# Patient Record
Sex: Male | Born: 1965 | Race: Black or African American | Hispanic: No | Smoking: Former smoker
Health system: Southern US, Community
[De-identification: ages and names within clinical notes are randomized; demographics above are authoritative.]

## PROBLEM LIST (undated history)

## (undated) DIAGNOSIS — R55 Syncope and collapse: Secondary | ICD-10-CM

## (undated) DIAGNOSIS — R001 Bradycardia, unspecified: Secondary | ICD-10-CM

## (undated) DIAGNOSIS — R45851 Suicidal ideations: Secondary | ICD-10-CM

## (undated) DIAGNOSIS — Z950 Presence of cardiac pacemaker: Secondary | ICD-10-CM

## (undated) DIAGNOSIS — F259 Schizoaffective disorder, unspecified: Secondary | ICD-10-CM

## (undated) DIAGNOSIS — IMO0002 Reserved for concepts with insufficient information to code with codable children: Secondary | ICD-10-CM

## (undated) DIAGNOSIS — F329 Major depressive disorder, single episode, unspecified: Secondary | ICD-10-CM

## (undated) DIAGNOSIS — F32A Depression, unspecified: Secondary | ICD-10-CM

## (undated) DIAGNOSIS — I2510 Atherosclerotic heart disease of native coronary artery without angina pectoris: Secondary | ICD-10-CM

---

## 2001-04-14 ENCOUNTER — Emergency Department (HOSPITAL_COMMUNITY): Admission: EM | Admit: 2001-04-14 | Discharge: 2001-04-14 | Payer: Self-pay | Admitting: Emergency Medicine

## 2005-09-22 HISTORY — PX: PACEMAKER INSERTION: SHX728

## 2005-11-14 ENCOUNTER — Ambulatory Visit: Payer: Self-pay | Admitting: Family Medicine

## 2006-01-30 ENCOUNTER — Ambulatory Visit (HOSPITAL_COMMUNITY): Admission: RE | Admit: 2006-01-30 | Discharge: 2006-01-30 | Payer: Self-pay

## 2006-04-13 ENCOUNTER — Encounter: Payer: Self-pay | Admitting: Cardiology

## 2006-08-15 ENCOUNTER — Ambulatory Visit: Payer: Self-pay | Admitting: Cardiology

## 2007-05-29 ENCOUNTER — Ambulatory Visit: Payer: Self-pay | Admitting: Cardiology

## 2007-05-31 ENCOUNTER — Inpatient Hospital Stay (HOSPITAL_COMMUNITY): Admission: AD | Admit: 2007-05-31 | Discharge: 2007-06-01 | Payer: Self-pay | Admitting: *Deleted

## 2007-06-09 ENCOUNTER — Emergency Department (HOSPITAL_COMMUNITY): Admission: EM | Admit: 2007-06-09 | Discharge: 2007-06-09 | Payer: Self-pay | Admitting: Physician Assistant

## 2007-06-12 ENCOUNTER — Emergency Department (HOSPITAL_COMMUNITY): Admission: EM | Admit: 2007-06-12 | Discharge: 2007-06-13 | Payer: Self-pay | Admitting: Emergency Medicine

## 2007-06-29 ENCOUNTER — Emergency Department (HOSPITAL_COMMUNITY): Admission: EM | Admit: 2007-06-29 | Discharge: 2007-06-29 | Payer: Self-pay | Admitting: Emergency Medicine

## 2007-07-07 ENCOUNTER — Emergency Department (HOSPITAL_COMMUNITY): Admission: EM | Admit: 2007-07-07 | Discharge: 2007-07-07 | Payer: Self-pay | Admitting: Emergency Medicine

## 2008-02-12 ENCOUNTER — Emergency Department (HOSPITAL_COMMUNITY): Admission: EM | Admit: 2008-02-12 | Discharge: 2008-02-12 | Payer: Self-pay | Admitting: Emergency Medicine

## 2008-06-06 ENCOUNTER — Emergency Department (HOSPITAL_COMMUNITY): Admission: EM | Admit: 2008-06-06 | Discharge: 2008-06-06 | Payer: Self-pay | Admitting: Emergency Medicine

## 2008-06-19 ENCOUNTER — Emergency Department (HOSPITAL_COMMUNITY): Admission: EM | Admit: 2008-06-19 | Discharge: 2008-06-19 | Payer: Self-pay | Admitting: Emergency Medicine

## 2008-07-05 ENCOUNTER — Emergency Department (HOSPITAL_COMMUNITY): Admission: EM | Admit: 2008-07-05 | Discharge: 2008-07-05 | Payer: Self-pay | Admitting: Emergency Medicine

## 2008-07-17 ENCOUNTER — Emergency Department (HOSPITAL_COMMUNITY): Admission: EM | Admit: 2008-07-17 | Discharge: 2008-07-18 | Payer: Self-pay | Admitting: Emergency Medicine

## 2008-07-17 ENCOUNTER — Ambulatory Visit: Payer: Self-pay | Admitting: Psychiatry

## 2008-07-18 ENCOUNTER — Inpatient Hospital Stay (HOSPITAL_COMMUNITY): Admission: RE | Admit: 2008-07-18 | Discharge: 2008-07-24 | Payer: Self-pay | Admitting: Psychiatry

## 2008-10-18 ENCOUNTER — Emergency Department (HOSPITAL_COMMUNITY): Admission: EM | Admit: 2008-10-18 | Discharge: 2008-10-18 | Payer: Self-pay | Admitting: Emergency Medicine

## 2009-01-11 ENCOUNTER — Emergency Department (HOSPITAL_COMMUNITY): Admission: EM | Admit: 2009-01-11 | Discharge: 2009-01-11 | Payer: Self-pay | Admitting: Emergency Medicine

## 2009-04-10 ENCOUNTER — Emergency Department (HOSPITAL_COMMUNITY): Admission: EM | Admit: 2009-04-10 | Discharge: 2009-04-11 | Payer: Self-pay | Admitting: Emergency Medicine

## 2009-04-27 ENCOUNTER — Emergency Department (HOSPITAL_COMMUNITY): Admission: EM | Admit: 2009-04-27 | Discharge: 2009-04-27 | Payer: Self-pay | Admitting: Emergency Medicine

## 2010-02-23 ENCOUNTER — Emergency Department (HOSPITAL_COMMUNITY): Admission: EM | Admit: 2010-02-23 | Discharge: 2010-02-23 | Payer: Self-pay | Admitting: Emergency Medicine

## 2010-07-25 ENCOUNTER — Encounter: Payer: Self-pay | Admitting: Family Medicine

## 2010-10-04 NOTE — Letter (Signed)
Summary: medical release  medical release   Imported By: Lind Guest 08/05/2010 11:32:08  _____________________________________________________________________  External Attachment:    Type:   Image     Comment:   External Document

## 2010-11-20 ENCOUNTER — Emergency Department (HOSPITAL_COMMUNITY): Payer: Self-pay

## 2010-11-20 ENCOUNTER — Observation Stay (HOSPITAL_COMMUNITY)
Admission: EM | Admit: 2010-11-20 | Discharge: 2010-11-21 | Disposition: A | Discharge status: Other Acute Inpatient Hospital | Payer: Self-pay | Attending: Internal Medicine | Admitting: Internal Medicine

## 2010-11-20 DIAGNOSIS — R1013 Epigastric pain: Secondary | ICD-10-CM | POA: Insufficient documentation

## 2010-11-20 DIAGNOSIS — R0609 Other forms of dyspnea: Secondary | ICD-10-CM | POA: Insufficient documentation

## 2010-11-20 DIAGNOSIS — R0989 Other specified symptoms and signs involving the circulatory and respiratory systems: Secondary | ICD-10-CM | POA: Insufficient documentation

## 2010-11-20 DIAGNOSIS — D696 Thrombocytopenia, unspecified: Secondary | ICD-10-CM | POA: Insufficient documentation

## 2010-11-20 DIAGNOSIS — F411 Generalized anxiety disorder: Secondary | ICD-10-CM | POA: Insufficient documentation

## 2010-11-20 DIAGNOSIS — R61 Generalized hyperhidrosis: Secondary | ICD-10-CM | POA: Insufficient documentation

## 2010-11-20 DIAGNOSIS — Z950 Presence of cardiac pacemaker: Secondary | ICD-10-CM | POA: Insufficient documentation

## 2010-11-20 DIAGNOSIS — R110 Nausea: Secondary | ICD-10-CM | POA: Insufficient documentation

## 2010-11-20 DIAGNOSIS — R45851 Suicidal ideations: Secondary | ICD-10-CM | POA: Insufficient documentation

## 2010-11-20 DIAGNOSIS — K219 Gastro-esophageal reflux disease without esophagitis: Secondary | ICD-10-CM | POA: Insufficient documentation

## 2010-11-20 DIAGNOSIS — R112 Nausea with vomiting, unspecified: Secondary | ICD-10-CM | POA: Insufficient documentation

## 2010-11-20 DIAGNOSIS — D649 Anemia, unspecified: Secondary | ICD-10-CM | POA: Insufficient documentation

## 2010-11-20 DIAGNOSIS — R079 Chest pain, unspecified: Principal | ICD-10-CM | POA: Insufficient documentation

## 2010-11-20 LAB — COMPREHENSIVE METABOLIC PANEL
ALT: 16 U/L (ref 0–53)
AST: 28 U/L (ref 0–37)
Albumin: 3.7 g/dL (ref 3.5–5.2)
Alkaline Phosphatase: 87 U/L (ref 39–117)
BUN: 8 mg/dL (ref 6–23)
BUN: 8 mg/dL (ref 6–23)
CO2: 29 mEq/L (ref 19–32)
Chloride: 105 mEq/L (ref 96–112)
Creatinine, Ser: 0.89 mg/dL (ref 0.4–1.5)
Creatinine, Ser: 0.95 mg/dL (ref 0.4–1.5)
GFR calc non Af Amer: >60 mL/min (ref >60)
Glucose, Bld: 84 mg/dL (ref 70–99)
Potassium: 3.9 mEq/L (ref 3.5–5.1)
Sodium: 138 mEq/L (ref 135–145)
Total Bilirubin: 0.7 mg/dL (ref 0.3–1.2)
Total Bilirubin: 0.5 mg/dL (ref 0.3–1.2)
Total Protein: 6.6 g/dL (ref 6.0–8.3)

## 2010-11-20 LAB — CARDIAC PANEL(CRET KIN+CKTOT+MB+TROPI)
CK, MB: 1.3 ng/mL (ref 0.3–4.0)
CK, MB: 1.9 ng/mL (ref 0.3–4.0)
Relative Index: 0.9 (ref 0.0–2.5)
Total CK: 167 U/L (ref 7–232)
Troponin I: <0.01 ng/mL (ref 0.00–0.06)

## 2010-11-20 LAB — POCT I-STAT, CHEM 8
Calcium, Ion: 1.14 mmol/L (ref 1.12–1.32)
HCT: 40 % (ref 39.0–52.0)
Hemoglobin: 13.6 g/dL (ref 13.0–17.0)
Sodium: 138 mEq/L (ref 135–145)
TCO2: 27 mmol/L (ref 0–100)

## 2010-11-20 LAB — IRON AND TIBC
Iron: 131 ug/dL (ref 42–135)
TIBC: 294 ug/dL (ref 215–435)
UIBC: 163 ug/dL

## 2010-11-20 LAB — DIFFERENTIAL
Basophils Relative: 0 % (ref 0–1)
Eosinophils Relative: 3 % (ref 0–5)
Lymphs Abs: 2.1 10*3/uL (ref 0.7–4.0)
Monocytes Relative: 11 % (ref 3–12)
Neutro Abs: 3.8 10*3/uL (ref 1.7–7.7)
Neutrophils Relative %: 55 % (ref 43–77)

## 2010-11-20 LAB — CBC
HCT: 36.8 % — ABNORMAL LOW (ref 39.0–52.0)
MCH: 33.5 pg (ref 26.0–34.0)
MCHC: 34 g/dL (ref 30.0–36.0)
MCV: 98.7 fL (ref 78.0–100.0)

## 2010-11-20 LAB — POCT CARDIAC MARKERS
CKMB, poc: <1 ng/mL — ABNORMAL LOW (ref 1.0–8.0)
CKMB, poc: <1 ng/mL — ABNORMAL LOW (ref 1.0–8.0)
Myoglobin, poc: 42 ng/mL (ref 12–200)
Myoglobin, poc: 45.1 ng/mL (ref 12–200)
Troponin i, poc: <0.05 ng/mL (ref 0.00–0.09)

## 2010-11-20 LAB — RAPID URINE DRUG SCREEN, HOSP PERFORMED
Amphetamines: NOT DETECTED
Benzodiazepines: NOT DETECTED
Cocaine: NOT DETECTED
Opiates: POSITIVE — AB
Tetrahydrocannabinol: NOT DETECTED

## 2010-11-20 LAB — HIV ANTIBODY (ROUTINE TESTING W REFLEX): HIV: NONREACTIVE

## 2010-11-20 LAB — TSH: TSH: 1.722 u[IU]/mL (ref 0.350–4.500)

## 2010-11-21 ENCOUNTER — Inpatient Hospital Stay (HOSPITAL_COMMUNITY)
Admission: AD | Admit: 2010-11-21 | Discharge: 2010-11-26 | DRG: 885 | Disposition: A | Discharge status: Home / Self Care | Payer: 59 | Source: Ambulatory Visit | Attending: Psychiatry | Admitting: Psychiatry

## 2010-11-21 ENCOUNTER — Other Ambulatory Visit: Payer: Self-pay | Admitting: Internal Medicine

## 2010-11-21 DIAGNOSIS — Z7982 Long term (current) use of aspirin: Secondary | ICD-10-CM

## 2010-11-21 DIAGNOSIS — Z560 Unemployment, unspecified: Secondary | ICD-10-CM

## 2010-11-21 DIAGNOSIS — F319 Bipolar disorder, unspecified: Principal | ICD-10-CM

## 2010-11-21 DIAGNOSIS — R45851 Suicidal ideations: Secondary | ICD-10-CM

## 2010-11-21 DIAGNOSIS — F259 Schizoaffective disorder, unspecified: Secondary | ICD-10-CM

## 2010-11-21 DIAGNOSIS — R079 Chest pain, unspecified: Secondary | ICD-10-CM

## 2010-11-21 DIAGNOSIS — D696 Thrombocytopenia, unspecified: Secondary | ICD-10-CM

## 2010-11-21 DIAGNOSIS — K219 Gastro-esophageal reflux disease without esophagitis: Secondary | ICD-10-CM

## 2010-11-21 DIAGNOSIS — Z950 Presence of cardiac pacemaker: Secondary | ICD-10-CM

## 2010-11-21 DIAGNOSIS — IMO0002 Reserved for concepts with insufficient information to code with codable children: Secondary | ICD-10-CM

## 2010-11-21 LAB — BASIC METABOLIC PANEL
BUN: 8 mg/dL (ref 6–23)
CO2: 28 mEq/L (ref 19–32)
Calcium: 8.9 mg/dL (ref 8.4–10.5)
Creatinine, Ser: 0.89 mg/dL (ref 0.4–1.5)
GFR calc Af Amer: >60 mL/min (ref >60)

## 2010-11-21 LAB — CBC
Hemoglobin: 13.3 g/dL (ref 13.0–17.0)
MCH: 33.3 pg (ref 26.0–34.0)
MCHC: 33.8 g/dL (ref 30.0–36.0)
MCV: 98.3 fL (ref 78.0–100.0)

## 2010-11-21 LAB — LIPID PANEL
LDL Cholesterol: 37 mg/dL (ref 0–99)
Total CHOL/HDL Ratio: 1.8 RATIO
VLDL: 6 mg/dL (ref 0–40)

## 2010-11-22 DIAGNOSIS — F319 Bipolar disorder, unspecified: Secondary | ICD-10-CM

## 2010-11-22 DIAGNOSIS — F259 Schizoaffective disorder, unspecified: Secondary | ICD-10-CM
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
## 2010-11-26 LAB — HEPATIC FUNCTION PANEL
ALT: 34 U/L (ref 0–53)
AST: 41 U/L — ABNORMAL HIGH (ref 0–37)
Albumin: 3.6 g/dL (ref 3.5–5.2)
Alkaline Phosphatase: 100 U/L (ref 39–117)
Total Bilirubin: 0.6 mg/dL (ref 0.3–1.2)
Total Protein: 6.4 g/dL (ref 6.0–8.3)

## 2010-11-26 LAB — CBC
HCT: 40.8 % (ref 39.0–52.0)
Hemoglobin: 13 g/dL (ref 13.0–17.0)
RBC: 4.04 MIL/uL — ABNORMAL LOW (ref 4.22–5.81)
WBC: 7.6 10*3/uL (ref 4.0–10.5)

## 2010-11-26 NOTE — Consult Note (Signed)
  NAME:  Harry Zhang, Harry Zhang NO.:  [id number]  MEDICAL RECORD NO.:  [id number]           PATIENT TYPE:  O  LOCATION:  3730                         FACILITY:  [facility]  PHYSICIAN:  Eulogio Ditch, MD DATE OF BIRTH:  1966/07/26  DATE OF CONSULTATION:  11/21/2010 DATE OF DISCHARGE:                                CONSULTATION   REASON FOR CONSULTATION:  Depression and suicidal ideation.  HISTORY OF PRESENT ILLNESS:  A 45 year old male with history of recurrent syncopal episode.  The patient has a pacemaker implant.  The patient also has a history of bipolar disorder and in the past, he was on Depakote and Haldol and was admitted to behavioral health.  The patient told me after discharge he followed at [facility], but then stopped taking his medications.  The patient told me that he on and off hear voices, but the patient does not seem to be internally preoccupied, he is very logical and goal directed during the interview.  The patient also told me that he is not going to lie about suicidal ideation and he has suicidal ideations on and off and will be good idea for him to be admitted to behavioral health to work on suicidal ideation and hearing voices.  The patient denies abusing any drugs or alcohol abuse.  The patient lives in [place].  He is not working.  He is divorced.  PAST MEDICAL HISTORY:  Significant for pacemaker implant in 2007, secondary to recurrent syncope.  ALLERGIES:  No known drug allergies.  MENTAL STATUS EXAM:  The patient is calm, cooperative during interview. Fair eye contact.  No psychomotor agitation or retardation noted during the interview.  Speech normal in rate, rhythm, and volume.  Mood depressed, affect mood congruent.  Thought process logical and goal directed.  Does not seem to be internally preoccupied.  No circumstantiality or tangentiality noted during the interview.  Thought content, suicidal ideations present without specific plan,  not delusional.  Thought perception, reported hearing voices, telling him to remove the pacemaker.  The patient does not seem to be internally preoccupied.  Cognition alert, awake, oriented x3.  Memory immediate, recent remote fair.  Attention and concentration fair.  Abstraction and ability fair.  Insight and judgment fair.  DIAGNOSES: Axis I:  As per history bipolar disorder, mixed type. Axis II:  Deferred. Axis III:  See medical notes. Axis IV:  Chronic mental issues along with medical issues. Axis V:  40.  RECOMMENDATIONS: 1. Transfer the patient to behavioral health hospital after the     patient is medically stable. 2. I will start the patient on Haldol 5 mg at bedtime for hearing     voices. 3. I will follow up on this patient as needed. 4. I discussed the case with the clinical social worker, Delice Bison, for     transfer to behavioral health.  Thanks for involving me in taking care of this patient.     Eulogio Ditch, MD     SA/MEDQ  D:  11/21/2010  T:  11/21/2010  Job:  [id number]  Electronically Signed by Eulogio Ditch  on 11/26/2010 07:17:29 AM

## 2010-11-29 NOTE — Discharge Summary (Signed)
NAME:  Harry Zhang, Harry Zhang NO.:  [id number]  MEDICAL RECORD NO.:  [id number]           PATIENT TYPE:  I  LOCATION:  0501                          [facility]ACILITY[facility]  [facility]  PHYSICIAN:  Marlis Edelson, DO        DATE OF BIRTH:  10-Mar-1966  DATE OF ADMISSION:  11/21/2010 DATE OF DISCHARGE:  11/26/2010                              DISCHARGE SUMMARY   REASON FOR ADMISSION:  The patient was a transfer from the medical floor after the patient was assessed for chest pain.  The patient was having some suicidal thoughts and had been off his medications for approximately 4 months.  The patient had had a pacemaker placed few years ago and was having thoughts to wanting to cut it out.  He felt frustrated with his pacemaker.  He felt that he was a very healthy person.  He was having mood swings.  FINAL DIAGNOSES:  AXIS I:  His final diagnosis was schizoaffective disorder bipolar type. AXIS II: Deferred. AXIS III: Is a history of syncope, GERD, thrombocytopenia and degenerative disk disease.  LABORATORY DATA:  Significant labs:  Liver function shows an AST of 41. His Depakote level was 52.9.  CBC shows a MCV of 101.  TSH of 1.722. Urine drug screen was positive for opiates.  SIGNIFICANT FINDINGS:  This is a middle-aged male initially disheveled with poor eye contact.  His speech was normal, fully alert.  His thought processes were coherent, but endorsing auditory hallucinations.  His judgment was poor.  We admitted the patient to the adult milieu.  We started the patient on his Depakote and continue with his Haldol.  He was improving and feeling great.  Denied any suicidal or homeless homicidal thoughts or psychotic symptoms.  He  was tolerating his medications.  He had a sense of humor.  He still endorsed some worry about his pacemaker but denied any thoughts to take the pacemaker out. He continued to feel well and was sleeping satisfactorily.  Denied any side effects to his  medications.  Had no racing thoughts or psychotic symptoms.  He felt the groups were helping to talk out his problems.  We had ordered a Depakote level and some lab work.  We contacted the patient's mother who had no concerns about the patient returning home. We addressed any safety issues and provided information.  On day of discharge, the patient is fully alert and cooperative with good eye contact.  Denied any suicidal or homicidal thoughts or psychotic symptoms, was understanding about his follow up and med compliance.  DISCHARGE MEDICATIONS:  His discharge medications: 1. Depakote ER 500 mg 1 tablet b.i.d. 2. Haldol 5 mg nightly. 3. Aspirin 81 mg daily. 4. Protonix 40 mg q.12 h.  FOLLOWUP:  His follow-up appointment was at [facility] in [place], phone number [phone] on Thursday March 27 and the patient had appointment in [facility] on March 26 at 1:45.     Landry Corporal, N.P.   ______________________________ Marlis Edelson, DO    JO/MEDQ  D:  11/28/2010  T:  11/28/2010  Job:  [id number]  Electronically Signed by Landry Corporal  N.P. on 11/28/2010 12:05:11 PM Electronically Signed by Marlis Edelson MD on 11/29/2010 08:59:31 PM

## 2010-12-10 LAB — BASIC METABOLIC PANEL
BUN: 12 mg/dL (ref 6–23)
CO2: 33 mEq/L — ABNORMAL HIGH (ref 19–32)
CO2: 31 mEq/L (ref 19–32)
Chloride: 104 mEq/L (ref 96–112)
Chloride: 99 mEq/L (ref 96–112)
Creatinine, Ser: 1.41 mg/dL (ref 0.4–1.5)
GFR calc Af Amer: >60 mL/min (ref >60)
Potassium: 3.9 mEq/L (ref 3.5–5.1)
Potassium: 4 mEq/L (ref 3.5–5.1)

## 2010-12-10 LAB — CBC
HCT: 37.5 % — ABNORMAL LOW (ref 39.0–52.0)
HCT: 38.8 % — ABNORMAL LOW (ref 39.0–52.0)
MCHC: 34.5 g/dL (ref 30.0–36.0)
MCHC: 34.4 g/dL (ref 30.0–36.0)
MCV: 99.8 fL (ref 78.0–100.0)
MCV: 99.7 fL (ref 78.0–100.0)
Platelets: 125 10*3/uL — ABNORMAL LOW (ref 150–400)
RBC: 3.89 MIL/uL — ABNORMAL LOW (ref 4.22–5.81)
WBC: 7.5 10*3/uL (ref 4.0–10.5)
WBC: 6.4 10*3/uL (ref 4.0–10.5)

## 2010-12-10 LAB — POCT CARDIAC MARKERS
Troponin i, poc: <0.05 ng/mL (ref 0.00–0.09)
Troponin i, poc: <0.05 ng/mL (ref 0.00–0.09)

## 2010-12-10 LAB — DIFFERENTIAL
Basophils Relative: 0 % (ref 0–1)
Eosinophils Absolute: 0.3 10*3/uL (ref 0.0–0.7)
Eosinophils Relative: 4 % (ref 0–5)
Lymphs Abs: 1.9 10*3/uL (ref 0.7–4.0)
Monocytes Relative: 10 % (ref 3–12)
Neutrophils Relative %: 61 % (ref 43–77)

## 2010-12-10 LAB — VALPROIC ACID LEVEL: Valproic Acid Lvl: <10 ug/mL — ABNORMAL LOW (ref 50.0–100.0)
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
## 2010-12-20 NOTE — Discharge Summary (Signed)
NAME:  Harry Zhang, Harry Zhang              ACCOUNT NO.:  [id number]  MEDICAL RECORD NO.:  [id number]           PATIENT TYPE:  O  LOCATION:  3730                         FACILITY:  [facility]  PHYSICIAN:  Lonia Blood, M.D.      DATE OF BIRTH:  [birth year]  DATE OF ADMISSION:  11/20/2010 DATE OF DISCHARGE:  11/21/2010                              DISCHARGE SUMMARY   PRIMARY CARE PHYSICIAN:  The patient is unassigned to [place].  DISCHARGE DIAGNOSES: 1. Chest pain, cardiac, rule out. 2. Suicidal ideation. 3. Midepigastric pain with some nausea and vomiting. 4. Mild anemia. 5. Thrombocytopenia. 6. Depression/anxiety. 7. Gastroesophageal reflux disease.  DISCHARGE MEDICATIONS: 1. Haldol 5 mg p.o. at bedtime. 2. Protonix 40 mg p.o. daily. 3. Aspirin 81 mg daily.  DISPOSITION:  The patient will be discharged to inpatient behavioral health.  He is not suicidal at the moment, but he has been having on and off suicidal ideations.  It is recommended by Psychiatry that the patient be inpatient.  PROCEDURES PERFORMED THIS ADMISSION:  A chest x-ray on November 20, 2010, shows stable exam with no active disease.  CONSULTATIONS:  Dr. Eulogio Ditch, Psychiatry.  BRIEF HISTORY AND PHYSICAL:  Please refer to dictated history and physical by Dr. Vassie Loll.  The patient is a 45 year old African- American man with known history of depression, anxiety, and prior admission to behavioral health, who apparently has been off his medications 4 months.  He is also status post pacemaker implant about 5 years ago in [place] secondary to recurrent syncopal episodes.  The patient came in with chest pain, which started last night.  He was apparently watching TV on the couch when he started and the pain was 8- 9/10.  No radiation.  With some mild diaphoresis, dyspnea, and nausea. The patient was brought to the emergency room where his chest pain and other symptoms have since improved.  He was admitted for rule out  MI. During interrogation, the patient reported having on and off suicidal ideations.  Hence he was also admitted for evaluation of his psychiatric status.  HOSPITAL COURSE: 1. Chest pain.  The patient's chest pain was fully evaluated.  Cardiac     enzymes x3 were negative.  His chest pains have improved.  His     lipid panel was normal.  The final impression is that the patient     did not have cardiac chest pain, is probably more likely to be due     to his anxiety and GERD.  He is therefore placed on PPIs.  He does     not seem to be a candidate for stress test at this point. 2. Suicidal ideation.  The patient was admitted with suicide     precaution and a sitter in place.  He is today saying is not     suicidal, but he has been having recurrent suicidal ideations.  He     has been off his medications.  Psychiatry was consulted and based     on psychiatric recommendations, the patient is being transferred to     inpatient behavioral health. 3. Mild anemia and  thrombocytopenia.  This may be chronic probably for     alcoholism, although he denies. 4. GERD.  The patient is now on PPIs, although he was not taking any     medicine prior to coming in.  Other than that the patient seems     very much stable and ready for transfer to behavioral health     center.  He is medically cleared at this point.     Lonia Blood, M.D.     Verlin Grills  D:  11/21/2010  T:  11/21/2010  Job:  [id number]  Electronically Signed by Lonia Blood M.D. on 12/20/2010 03:40:34 PM

## 2011-01-17 NOTE — Discharge Summary (Signed)
NAME:  Harry Zhang, Harry Zhang NO.:  [id number]   MEDICAL RECORD NO.:  [id number]          PATIENT TYPE:  INP   LOCATION:  3712                         FACILITY:  [facility]   PHYSICIAN:  Dani Gobble, MD       DATE OF BIRTH:  1965-09-26   DATE OF ADMISSION:  05/31/2007  DATE OF DISCHARGE:  06/01/2007                               DISCHARGE SUMMARY   [name] is a 45 year old African American male patient with a prior  history of syncope and 6-second pauses.  He had a pacemaker placement in  [place].  He had been being seen by Dr. Andee Lineman, but he apparently then  wanted to change to Dr. Jenne Campus.  He was at [facility] with chest  pain.  He had an adenosine Cardiolite.  It was positive for ischemia.  Thus, he was transferred to [facility] for further evaluation.  He  underwent cardiac catheterization on May 31, 2007 by Dr. Nicki Guadalajara.  He had normal LV function, and he had normal coronary arteries.  No coronary artery disease.  He was kept overnight, and he was ready for  discharge on June 01, 2007.  He was asked to follow up with Dr.  Jenne Campus as an outpatient.  He will call him when he is able to make an  appointment.  He should follow up with his primary care doctor which he  states at this point he does not have one.  He should do no strenuous  lifting, pushing, pulling, or extended walking for a week.  If he has  any groin problems, he will give our office a call.  He should be on  Prilosec 20 mg a day.  He can buy that over-the-counter, either that or  Zantac or Pepcid.   DISCHARGE DIAGNOSES:  1. Chest pain, noncoronary ischemia, possible gastrointestinal-      related, treated prophylactically with proton pump inhibitors.  2. __________  placed in [place] for apparently 6-second pauses.  He has      been requested to follow up with Dr. Jenne Campus.  He knows to give our      office a call for follow up.      Lezlie Octave, N.P.    ______________________________  Dani Gobble, MD    BB/MEDQ  D:  06/01/2007  T:  06/02/2007  Job:  [id number]

## 2011-01-17 NOTE — Cardiovascular Report (Signed)
NAME:  Harry Zhang, Harry Zhang NO.:  [id number]   MEDICAL RECORD NO.:  [id number]          PATIENT TYPE:  INP   LOCATION:  3712                         FACILITY:  [facility]   PHYSICIAN:  Nicki Guadalajara, M.D.     DATE OF BIRTH:  06/24/1966   DATE OF PROCEDURE:  05/31/2007  DATE OF DISCHARGE:                            CARDIAC CATHETERIZATION   INDICATIONS:  [name] is a 45 year old, African American  gentleman who is a patient of Dr. Jenne Campus.  Apparently, he is status  post permanent pacemaker insertion while living in [place] in January  2007.  Apparently, the patient has moved to [facility] and sees Dr.  Jenne Campus for pacemaker followup.  Apparently, he had presented to  [facility] with chest pain.  There were some atypical features.  However, a nuclear stress test was done which raised the possibility of  mild ischemia.  For this reason, he was transferred to [facility] to undergo definitive diagnostic cardiac catheterization.   PROCEDURE:  After premedication with Versed 2 mg intravenously, the  patient was prepped and draped in the usual fashion.  His right femoral  artery was punctured anteriorly and a 5-French sheath was inserted  without difficulty.  Diagnostic cardiac catheterization was done  utilizing 5-French, J4 left and right coronary catheters.  A 5-French  pigtail catheter was used for biplane cineventriculography.  The patient  tolerated the procedure well.  Hemostasis was obtained by direct manual  pressure.   Central aortic pressure 120/74.  Left ventricle pressure 120/10.   ANGIOGRAPHIC DATA:  Left main coronary artery was very short and  immediately bifurcated into a large LAD system and a small to moderate-  sized circumflex system.   The LAD was angiographically normal, but was tortuous and gave rise to  two prominent diagonal vessels, several septal perforating arteries and  also wrapped around the LV apex.  There also was  visualization through  the injection of a late venous phase.  There was no significant  obstructive disease or evidence for any obvious fistula.  The circumflex  vessel was angiographically normal and gave rise to one marginal vessel.   The right coronary artery was a very tortuous, dominant, large, normal  vessel which gave rise to large PDA and bifurcating posterolateral  vessel.   Biplane cineventriculography revealed normal global contractility.  Although, there was a suggestion of minimal hang up in the  posterobasal segment, this region contracted normally.   IMPRESSION:  1. Normal left ventricular function.  2. Normal coronary arteries.  3. History of atrioventricular sequential pacemaker insertion in      January 2007, in [place].           ______________________________  Nicki Guadalajara, M.D.     TK/MEDQ  D:  05/31/2007  T:  06/01/2007  Job:  [id number]   cc:   Darlin Priestly, MD  [facility]  Cardiac Lab  Francia Greaves, M.D.
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
## 2011-01-20 NOTE — Assessment & Plan Note (Signed)
[facility]                          [facility] NOTE   NAME:Harry Zhang, Harry Zhang                     MRN:          [id number]  DATE:08/15/2006                            DOB:          1965/11/27    REFERRING PHYSICIAN:  Wyvonnia Lora   HISTORY OF PRESENT ILLNESS:  Patient is a 45 year old male with a  history of dual-chamber pacemaker implant for sick sinus syndrome.  The  patient was previously seen by [facility].  The patient now  wants followup in our office.  He lives in [place] and works at [employer].  The patient was admitted on April 13, 2006 with chest pain.  He ruled out for myocardial infarction.  He had an echocardiographic  study done which demonstrated no evidence of structural heart disease.  The patient has been doing well.  He has occasional atypical episodes of  chest pain.  He has good exercise tolerance.  He has not had pacemaker  followup in 1 year.   MEDICATIONS:  Omeprazole 20 mg 2 tablets p.o. daily.   PHYSICAL EXAMINATION:  VITAL SIGNS:  Blood pressure 120/80.  Heart rate  is 74 beats per minute.  NECK EXAM:  Normal carotid upstrokes.  No carotid bruits.  LUNGS:  Clear breath sounds bilaterally.  HEART:  Regular rate and rhythm.  ABDOMEN:  Soft.  EXTREMITY EXAM:  No cyanosis, clubbing or edema.   PROBLEM LIST:  1. Atypical chest pain.  2. Negative Cardiolite study Jan 05, 2006.  3. Normal left ventricular function by echocardiography.  4. Status post pacemaker implantation with [employer] Jude identity ADXXLDR      model and Taxus September 22, 2005 secondary to sick sinus syndrome.  5. Drug use with previous positive cannabis screen.  6. Tobacco use.  7. History gastroesophageal reflux disease.   PLAN:  1. The patient does not need any further cardiac workup.  His chest      pain is nonanginal and noncardiac.  2. The patient has been set up for a complete pacemaker interrogation      on the next clinic  visit.  EKG  done today was within normal      limits.     Learta Codding, MD,FACC  Electronically Signed    GED/MedQ  DD: 08/15/2006  DT: 08/15/2006  Job #: [id number]   cc:   Wyvonnia Lora

## 2011-06-05 LAB — CBC
HCT: 38.8 — ABNORMAL LOW
HCT: 40.3
Hemoglobin: 13
Hemoglobin: 13
MCV: 101.9 — ABNORMAL HIGH
MCV: 102.3 — ABNORMAL HIGH
RBC: 3.79 — ABNORMAL LOW
RBC: 3.96 — ABNORMAL LOW
WBC: 17 — ABNORMAL HIGH
WBC: 6.9

## 2011-06-05 LAB — URINALYSIS, ROUTINE W REFLEX MICROSCOPIC
Glucose, UA: NEGATIVE
Protein, ur: NEGATIVE

## 2011-06-05 LAB — POCT CARDIAC MARKERS
Myoglobin, poc: 40.7
Myoglobin, poc: 40.8
Troponin i, poc: <0.05

## 2011-06-05 LAB — BASIC METABOLIC PANEL
CO2: 29
Chloride: 103
Chloride: 105
Creatinine, Ser: 1.21
GFR calc Af Amer: >60
GFR calc Af Amer: >60
Potassium: 3.9
Potassium: 4.2
Sodium: 139
Sodium: 140

## 2011-06-05 LAB — DIFFERENTIAL
Eosinophils Absolute: 0.2
Eosinophils Relative: 1
Lymphs Abs: 1.6
Monocytes Absolute: 0.7
Monocytes Relative: 4
Neutrophils Relative %: 85 — ABNORMAL HIGH

## 2011-06-06 LAB — BASIC METABOLIC PANEL
BUN: 8
CO2: 28
CO2: 30
Calcium: 9.5
Calcium: 9.5
Creatinine, Ser: 0.89
Creatinine, Ser: 1.12
GFR calc Af Amer: >60
GFR calc Af Amer: >60
Glucose, Bld: 93

## 2011-06-06 LAB — CBC
MCHC: 33.1
MCHC: 33.8
Platelets: 146 — ABNORMAL LOW
Platelets: 210
RBC: 4.26
RBC: 4.51
RDW: 14
WBC: 6.9

## 2011-06-06 LAB — DIFFERENTIAL
Basophils Absolute: 0
Basophils Relative: 0
Basophils Relative: 1
Monocytes Relative: 11
Neutro Abs: 4.4
Neutro Abs: 6.7
Neutrophils Relative %: 64
Neutrophils Relative %: 82 — ABNORMAL HIGH

## 2011-06-06 LAB — POCT CARDIAC MARKERS
CKMB, poc: <1 — ABNORMAL LOW
CKMB, poc: <1 — ABNORMAL LOW
Myoglobin, poc: 50.9
Troponin i, poc: <0.05

## 2011-06-06 LAB — D-DIMER, QUANTITATIVE: D-Dimer, Quant: 0.39

## 2011-06-06 LAB — ETHANOL: Alcohol, Ethyl (B): 5

## 2011-06-06 LAB — RAPID URINE DRUG SCREEN, HOSP PERFORMED
Amphetamines: NOT DETECTED
Benzodiazepines: NOT DETECTED

## 2011-06-07 LAB — HEPATIC FUNCTION PANEL
Albumin: 3.5
Alkaline Phosphatase: 84
Bilirubin, Direct: 0.1
Indirect Bilirubin: 0.6
Total Bilirubin: 0.7

## 2011-06-14 LAB — DIFFERENTIAL
Basophils Absolute: 0.1
Basophils Relative: 1
Eosinophils Absolute: 0.3
Monocytes Relative: 10
Neutro Abs: 8 — ABNORMAL HIGH
Neutrophils Relative %: 69

## 2011-06-14 LAB — BASIC METABOLIC PANEL
BUN: 14
CO2: 31
Calcium: 8.9
Chloride: 102
Creatinine, Ser: 0.88
GFR calc Af Amer: >60
Glucose, Bld: 96

## 2011-06-14 LAB — CBC
MCHC: 33.2
MCV: 99
RDW: 14.9 — ABNORMAL HIGH

## 2011-06-14 LAB — POCT CARDIAC MARKERS
Myoglobin, poc: 64.2
Operator id: 267321

## 2011-06-15 LAB — BASIC METABOLIC PANEL
BUN: 12
BUN: 11
CO2: 30
Chloride: 103
Chloride: 104
Creatinine, Ser: 0.98
Glucose, Bld: 118 — ABNORMAL HIGH
Glucose, Bld: 79
Potassium: 4.5
Potassium: 3.8

## 2011-06-15 LAB — URINALYSIS, ROUTINE W REFLEX MICROSCOPIC
Bilirubin Urine: NEGATIVE
Ketones, ur: NEGATIVE
Nitrite: NEGATIVE
Protein, ur: NEGATIVE

## 2011-06-15 LAB — FUNGUS CULTURE W SMEAR

## 2011-06-15 LAB — LIPID PANEL
Cholesterol: 88
HDL: 39 — ABNORMAL LOW

## 2011-06-15 LAB — RAPID URINE DRUG SCREEN, HOSP PERFORMED
Barbiturates: NOT DETECTED
Cocaine: NOT DETECTED
Opiates: NOT DETECTED
Tetrahydrocannabinol: POSITIVE — AB

## 2011-06-15 LAB — CBC
HCT: 39.2
HCT: 40.7
Hemoglobin: 13.1
MCHC: 33.4
MCV: 98.9
MCV: 97.7
Platelets: 191
Platelets: 165
RDW: 14.7 — ABNORMAL HIGH
RDW: 14.2 — ABNORMAL HIGH
WBC: 6.6

## 2011-06-15 LAB — DIFFERENTIAL
Basophils Absolute: 0
Eosinophils Absolute: 0
Eosinophils Relative: 0
Monocytes Absolute: 0.3

## 2011-06-15 LAB — KOH PREP: KOH Prep: NONE SEEN

## 2012-10-19 ENCOUNTER — Observation Stay (HOSPITAL_COMMUNITY)
Admission: EM | Admit: 2012-10-19 | Discharge: 2012-10-20 | Disposition: A | Discharge status: Home Health | Payer: 59 | Attending: Internal Medicine | Admitting: Internal Medicine

## 2012-10-19 ENCOUNTER — Encounter (HOSPITAL_COMMUNITY): Payer: Self-pay

## 2012-10-19 ENCOUNTER — Emergency Department (HOSPITAL_COMMUNITY): Payer: Self-pay

## 2012-10-19 ENCOUNTER — Observation Stay (HOSPITAL_COMMUNITY): Payer: Self-pay

## 2012-10-19 DIAGNOSIS — K219 Gastro-esophageal reflux disease without esophagitis: Secondary | ICD-10-CM | POA: Diagnosis present

## 2012-10-19 DIAGNOSIS — R0989 Other specified symptoms and signs involving the circulatory and respiratory systems: Secondary | ICD-10-CM | POA: Insufficient documentation

## 2012-10-19 DIAGNOSIS — R0600 Dyspnea, unspecified: Secondary | ICD-10-CM | POA: Diagnosis present

## 2012-10-19 DIAGNOSIS — R0609 Other forms of dyspnea: Secondary | ICD-10-CM | POA: Insufficient documentation

## 2012-10-19 DIAGNOSIS — F259 Schizoaffective disorder, unspecified: Secondary | ICD-10-CM | POA: Insufficient documentation

## 2012-10-19 DIAGNOSIS — I2510 Atherosclerotic heart disease of native coronary artery without angina pectoris: Secondary | ICD-10-CM | POA: Insufficient documentation

## 2012-10-19 DIAGNOSIS — R55 Syncope and collapse: Principal | ICD-10-CM | POA: Insufficient documentation

## 2012-10-19 DIAGNOSIS — I495 Sick sinus syndrome: Secondary | ICD-10-CM | POA: Insufficient documentation

## 2012-10-19 DIAGNOSIS — S139XXA Sprain of joints and ligaments of unspecified parts of neck, initial encounter: Secondary | ICD-10-CM

## 2012-10-19 HISTORY — DX: Atherosclerotic heart disease of native coronary artery without angina pectoris: I25.10

## 2012-10-19 LAB — BASIC METABOLIC PANEL
BUN: 14 mg/dL (ref 6–23)
CO2: 26 mEq/L (ref 19–32)
Chloride: 101 mEq/L (ref 96–112)
Glucose, Bld: 92 mg/dL (ref 70–99)
Potassium: 3.9 mEq/L (ref 3.5–5.1)
Sodium: 136 mEq/L (ref 135–145)

## 2012-10-19 LAB — CBC WITH DIFFERENTIAL/PLATELET
Basophils Relative: 0 % (ref 0–1)
Eosinophils Absolute: 0.1 10*3/uL (ref 0.0–0.7)
Eosinophils Relative: 1 % (ref 0–5)
HCT: 39.5 % (ref 39.0–52.0)
Hemoglobin: 13.5 g/dL (ref 13.0–17.0)
Lymphs Abs: 1.6 10*3/uL (ref 0.7–4.0)
MCH: 33.7 pg (ref 26.0–34.0)
MCHC: 34.2 g/dL (ref 30.0–36.0)
MCV: 98.5 fL (ref 78.0–100.0)
Monocytes Absolute: 0.6 10*3/uL (ref 0.1–1.0)
Monocytes Relative: 7 % (ref 3–12)
Neutrophils Relative %: 73 % (ref 43–77)
RBC: 4.01 MIL/uL — ABNORMAL LOW (ref 4.22–5.81)

## 2012-10-19 LAB — PROTIME-INR: Prothrombin Time: 13.2 seconds (ref 11.6–15.2)

## 2012-10-19 LAB — PRO B NATRIURETIC PEPTIDE: Pro B Natriuretic peptide (BNP): 62.7 pg/mL (ref 0–125)

## 2012-10-19 LAB — D-DIMER, QUANTITATIVE: D-Dimer, Quant: <0.27 ug/mL-FEU (ref 0.00–0.48)

## 2012-10-19 MED ORDER — INFLUENZA VIRUS VACC SPLIT PF IM SUSP
0.5000 mL | INTRAMUSCULAR | Status: AC
  Administered 2012-10-20: 0.5 mL via INTRAMUSCULAR
  Filled 2012-10-19: qty 0.5

## 2012-10-19 MED ORDER — THIAMINE HCL 100 MG/ML IJ SOLN
INTRAMUSCULAR | Status: AC
  Filled 2012-10-19: qty 2

## 2012-10-19 MED ORDER — FOLIC ACID 5 MG/ML IJ SOLN
INTRAMUSCULAR | Status: AC
  Filled 2012-10-19: qty 0.2

## 2012-10-19 MED ORDER — ACETAMINOPHEN 325 MG PO TABS: 650.0000 mg | ORAL_TABLET | Freq: Four times a day (QID) | ORAL | Status: DC | PRN

## 2012-10-19 MED ORDER — ALUM & MAG HYDROXIDE-SIMETH 200-200-20 MG/5ML PO SUSP: 30.0000 mL | Freq: Four times a day (QID) | ORAL | Status: DC | PRN

## 2012-10-19 MED ORDER — KETOROLAC TROMETHAMINE 30 MG/ML IJ SOLN
15.0000 mg | Freq: Three times a day (TID) | INTRAMUSCULAR | Status: DC | PRN
  Filled 2012-10-19: qty 1

## 2012-10-19 MED ORDER — PROMETHAZINE HCL 12.5 MG PO TABS: 12.5000 mg | ORAL_TABLET | Freq: Four times a day (QID) | ORAL | Status: DC | PRN

## 2012-10-19 MED ORDER — KETOROLAC TROMETHAMINE 30 MG/ML IJ SOLN
30.0000 mg | Freq: Once | INTRAMUSCULAR | Status: AC
  Administered 2012-10-19: 30 mg via INTRAVENOUS
  Filled 2012-10-19: qty 1

## 2012-10-19 MED ORDER — SODIUM CHLORIDE 0.9 % IV SOLN
Freq: Once | INTRAVENOUS | Status: AC
  Administered 2012-10-19: 15:00:00 via INTRAVENOUS

## 2012-10-19 MED ORDER — ZOLPIDEM TARTRATE 5 MG PO TABS: 5.0000 mg | ORAL_TABLET | Freq: Every evening | ORAL | Status: DC | PRN

## 2012-10-19 MED ORDER — ACETAMINOPHEN 650 MG RE SUPP: 650.0000 mg | Freq: Four times a day (QID) | RECTAL | Status: DC | PRN

## 2012-10-19 MED ORDER — SENNOSIDES-DOCUSATE SODIUM 8.6-50 MG PO TABS: 1.0000 | ORAL_TABLET | Freq: Every evening | ORAL | Status: DC | PRN

## 2012-10-19 MED ORDER — M.V.I. ADULT IV INJ
INJECTION | INTRAVENOUS | Status: AC
  Filled 2012-10-19: qty 10

## 2012-10-19 MED ORDER — ASPIRIN EC 81 MG PO TBEC
81.0000 mg | DELAYED_RELEASE_TABLET | Freq: Every day | ORAL | Status: DC
  Administered 2012-10-20: 81 mg via ORAL
  Filled 2012-10-19: qty 1

## 2012-10-19 MED ORDER — TRAZODONE HCL 50 MG PO TABS
50.0000 mg | ORAL_TABLET | Freq: Every day | ORAL | Status: DC
  Administered 2012-10-19: 50 mg via ORAL
  Filled 2012-10-19: qty 1

## 2012-10-19 MED ORDER — THIAMINE HCL 100 MG/ML IJ SOLN
Freq: Once | INTRAVENOUS | Status: AC
  Administered 2012-10-19: 23:00:00 via INTRAVENOUS
  Filled 2012-10-19: qty 1000

## 2012-10-19 NOTE — ED Provider Notes (Signed)
History     This chart was scribed for Vida Roller, MD, MD by Smitty Pluck, ED Scribe. The patient was seen in room APAH6/APAH6 and the patient's care was started at 2:29PM.   CSN: [id number]  Arrival date & time 10/19/12  1351      Chief Complaint  Patient presents with  . Loss of Consciousness     The history is provided by the patient, medical records and the EMS personnel. No language interpreter was used.   Harry Zhang is a 47 y.o. male hx of CAD and pacemaker who presents to the Emergency Department BIB EMS complaining of syncope today. Pt reports that he was shoveling snow yesterday, then again today, when came back in to rest but had syncopal episode in the doorway when he tried to walk back out of house. Pt reports that he had sudden onset of trouble breathing before episode of syncope and he felt "like he was drowning." He states that until the syncopal episode everything seemed normal. He does not remember the episode but his mother witnessed the event and told pt about the event he regained consciousness. Pt reports that he didn't eat any food today prior to syncope. Pt denies urinary incontinence, biting tongue, head injury, visual disturbances, fever, chills, nausea, vomiting, diarrhea, weakness, SOB and any other pain.  At this time he has no c/o  According to prior records, the pt had his pacemaker placed for Sick Sinus Syndrome causing recurrent syncope some time in 2007.  He had a clean coronary cath in 2008.     Past Medical History  Diagnosis Date  . Coronary artery disease     Past Surgical History  Procedure Laterality Date  . Pacemaker insertion      No family history on file.  History  Substance Use Topics  . Smoking status: Not on file  . Smokeless tobacco: Not on file  . Alcohol Use: Not on file      Review of Systems  Constitutional: Negative for fever and chills.  Respiratory: Positive for shortness of breath.   Cardiovascular:  Positive for chest pain.  Gastrointestinal: Negative for nausea and vomiting.  Neurological: Positive for syncope. Negative for dizziness and weakness.  All other systems reviewed and are negative.    Allergies  Bee pollen  Home Medications   Current Outpatient Rx  Name  Route  Sig  Dispense  Refill  . nitroGLYCERIN (NITROSTAT) 0.4 MG SL tablet   Sublingual   Place 0.4 mg under the tongue every 5 (five) minutes as needed for chest pain.           BP 106/57  Pulse 61  Temp(Src) 98.3 F (36.8 C) (Oral)  Resp 18  Ht 5\' 11"  (1.803 m)  Wt 180 lb (81.647 kg)  BMI 25.12 kg/m2  SpO2 99%  Physical Exam  Nursing note and vitals reviewed. Constitutional: He is oriented to person, place, and time. He appears well-developed and well-nourished. No distress.  HENT:  Head: Normocephalic and atraumatic.  Eyes: Conjunctivae are normal. Pupils are equal, round, and reactive to light.  Neck: Normal range of motion. Neck supple.  Cardiovascular: Normal rate, regular rhythm and normal heart sounds.   Pulmonary/Chest: Effort normal and breath sounds normal. No respiratory distress.  Neurological: He is alert and oriented to person, place, and time.  Skin: Skin is warm and dry.  Psychiatric: He has a normal mood and affect. His behavior is normal.    ED Course  Procedures (including critical care time) DIAGNOSTIC STUDIES: Oxygen Saturation is 99% on room air, normal by my interpretation.    COORDINATION OF CARE: 2:34 PM Discussed ED treatment with pt and pt agrees.  2:45 PM Ordered:  Medications  0.9 %  sodium chloride infusion ( Intravenous New Bag/Given 10/19/12 1504)       Labs Reviewed  CBC WITH DIFFERENTIAL - Abnormal; Notable for the following:    RBC 4.01 (*)    Platelets 149 (*)    All other components within normal limits  BASIC METABOLIC PANEL  APTT  PROTIME-INR  TROPONIN I   Dg Chest Port 1 View  10/19/2012  *RADIOLOGY REPORT*  Clinical Data: Chest pain.   PORTABLE CHEST - 1 VIEW  Comparison: 11/20/2010  Findings: Left pacer remains in stable position. Heart and mediastinal contours are within normal limits.  No focal opacities or effusions.  No acute bony abnormality.  IMPRESSION: No active cardiopulmonary disease.   Original Report Authenticated By: Charlett Nose, M.D.      1. Syncope       MDM  Pt has had recurrent syncopal episodes, check labs, ECG, CXR and interrogate his St. Jude pacer.  ED ECG REPORT  I personally interpreted this EKG   Date: 10/19/2012   Rate: 72  Rhythm: normal sinus rhythm  QRS Axis: right  Intervals: normal  ST/T Wave abnormalities: normal  Conduction Disutrbances:none  Narrative Interpretation:   Old EKG Reviewed: c/w Mar 18 /2012, no sig changes.  no pacing spikes seen   [employer]. Jude representative states that the patient's pacemaker has current battery which is normal, appears to be working appropriately but unfortunately she cannot acquire the data from the last 24 hours because the last time the patient had his pacemaker interrogated was 2007. The patient has had no further episodes of syncope, he does not have any signs of arrhythmia but I suspect that he had a ventricular arrhythmia. I have discussed his care with both the hospitalist and Dr. Karilyn Cota as well as Dr. Mayford Knife with cardiology. The patient will stay here on cardiac monitoring this evening.    I personally performed the services described in this documentation, which was scribed in my presence. The recorded information has been reviewed and is accurate.      Vida Roller, MD 10/19/12 [phone]

## 2012-10-19 NOTE — ED Notes (Signed)
Spoke with [employer]. Jude rep. She is on the way to interrogate pacemaker.

## 2012-10-19 NOTE — ED Notes (Signed)
Pt states he had sudden onset of CP with SOB, then passed out. Witnessed by mother. PT denies CP or SOB at this time.

## 2012-10-19 NOTE — H&P (Signed)
[facility] History and Physical  Harry Zhang WUJ:[id number] DOB: Jan 23, 1966 DOA: 10/19/2012  Referring physician: Antonieta Loveless PCP: No primary provider on file.  Specialists: none  Chief Complaint: Syncope  HPI: Harry Zhang is a 47 y.o. male with a past medical history for sinus arrest status post pacemaker insertion 2004, GERD, and schizoaffective disorder with multiple psychiatric hospitalizations for suicidal who presented via EMS to the emergency department after he had a syncopal episode at home today following strenuous snow shoveling for several hours. He came inside because he was very SOB and "needed to rest". He had sudden SOB while he was lying down -just before he "fell asleep". He had actually gotten up and ran to the doorway because he said he felt like he could not get air and the house he was in "felt hot".  His mother witnessed the event, he fell inside the doorway, hit his head on the wall and "twisted" his neck. He spontaneously regained consciousness, he does not recall the event. No recent illness, but he describes frequent episodes of night-time PND awakenings and orthopnea for the past few weeks. Denies any chest pain. He complains of feeling "very sore" he had a spasm in his left upper arm from shoveling the day prior. He complains now of a HA, and severe neck and upper back pain and stiffness.    Review of Systems: The patient denies anorexia, fever, weight loss,, vision loss, decreased hearing, hoarseness, chest pain,eripheral edema, balance deficits, hemoptysis, abdominal pain, melena, hematochezia, severe indigestion/heartburn, hematuria, incontinence, genital sores, muscle weakness, suspicious skin lesions, transient blindness, difficulty walking, depression, unusual weight change, abnormal bleeding, enlarged lymph nodes, angioedema, and breast masses.   Past Medical History  Diagnosis Date  . Coronary artery disease    Past Surgical History   Procedure Laterality Date  . Pacemaker insertion     Social History:  reports that he does not drink alcohol or use illicit drugs. His tobacco history is not on file. Lives at home with his mother and brother.  Allergies  Allergen Reactions  . Bee Pollen Anaphylaxis    No family history on file. No history of CAD.  Prior to Admission medications   Medication Sig Start Date End Date Taking? Authorizing Provider  nitroGLYCERIN (NITROSTAT) 0.4 MG SL tablet Place 0.4 mg under the tongue every 5 (five) minutes as needed for chest pain.   Yes Historical Provider, MD   Physical Exam: Filed Vitals:   10/19/12 1550 10/19/12 1713 10/19/12 1905 10/19/12 2043  BP: 107/61 109/74 96/66 101/70  Pulse: 71 66  70  Temp:    98 F (36.7 C)  TempSrc:    Oral  Resp: 22 16 18 20   Height:    5\' 11"  (1.803 m)  Weight:    74.299 kg (163 lb 12.8 oz)  SpO2: 96% 96% 97% 98%     General:  Awake, alert, NAD, cooperative  Eyes: PERRL  ENT: normal  Neck: palpable muscle spasm, point tenderness over C6-C7 SP  Cardiovascular: Regular rate and rhythm no murmurs rubs or gallops  Respiratory: Good auscultation bilaterally  Abdomen: Soft nontender  Skin: Very dry skin, no rashes or lesions  Musculoskeletal: Trapezius and deltoid muscle spasm  Psychiatric: Normal affect pleasant and cooperative  Neurologic: Nonfocal  Labs on Admission:  Basic Metabolic Panel:  Recent Labs Lab 10/19/12 1447  NA 136  K 3.9  CL 101  CO2 26  GLUCOSE 92  BUN 14  CREATININE 0.98  CALCIUM  8.9   CBC:  Recent Labs Lab 10/19/12 1447  WBC 8.9  NEUTROABS 6.5  HGB 13.5  HCT 39.5  MCV 98.5  PLT 149*   Cardiac Enzymes:  Recent Labs Lab 10/19/12 1447  TROPONINI <0.30   Radiological Exams on Admission: Dg Chest Port 1 View  10/19/2012  *RADIOLOGY REPORT*  Clinical Data: Chest pain.  PORTABLE CHEST - 1 VIEW  Comparison: 11/20/2010  Findings: Left pacer remains in stable position. Heart and  mediastinal contours are within normal limits.  No focal opacities or effusions.  No acute bony abnormality.  IMPRESSION: No active cardiopulmonary disease.   Original Report Authenticated By: Charlett Nose, M.D.     EKG: Independently reviewed. NSR. Right Axis.  Assessment/Plan  1. Syncope associated with acute dyspnea, etiology unclear. His description is concerning for cardio-pulmonary process vs. orthostasis or vaso-vagal from dehydration after snow shoveling and getting out of bed too quickly. He has a pacemaker but it has not been interoggated in years-[employer] [facility] did eval but could not retrieve data other than they confirmed it is working. He has a right axis on his EKG, otherwise normal.   Admitted for observation  Monitor tele, cycle enzymes  Check a BNP and 2D echo to r/o early CHF, given orthopnea  No orthostatic obtained in ED, he had already received several fluid boluses, renal function normal  Given his right axis, dyspnea and syncope will check a D. Dimer (low risk pt) if positive will proceed with CT Angio if negative will defer PE work up.  Will add on UDS.  2. Neck Sprain, Head contusion,  CT head and neck normal w/o fractures or subluxation. Will use NSAIDS, IV Toradol for pain, rec apply heat or ice to sore muscles.  3. Schizoaffective Disorder, behavior and affect were normal during my interview and exam, he is currently off his psychiatric medication and reports doing well. He is not seeing a PCP or a psychiatrist currently due to lack of resources and transportation.   Code Status: Presumed Full Code Family Communication:Discussed with patient at bedside Disposition Plan: Home when medically stable, admitted under observation, need pacemaker evaluated as an outpatient   Time spent: 70 minutes  [facility] Pager [phone]  If 7PM-7AM, please contact night-coverage [email or web address] Password [facility] 10/19/2012, 10:52 PM

## 2012-10-19 NOTE — ED Notes (Signed)
Monitor shows  NSR   Ready for transport

## 2012-10-19 NOTE — ED Notes (Signed)
Pt lying in bed playing with himself

## 2012-10-19 NOTE — ED Notes (Signed)
Pt states he has been shoveling snow for two days. Had a syncopal episode today and now complain of pain in left chest area

## 2012-10-19 NOTE — ED Notes (Signed)
Patient refused gown.  Patient was willing to remove shirt so I could put cardiac monitor on.

## 2012-10-19 NOTE — ED Notes (Signed)
Report from off-going RN - Pacemaker was last checked on 10/12/2005 so diagnostics have been frozen since around September 2009.  Reported that old data cleared out and will have to obtain current date from this point forward for diagnostics.  (per Carolan Clines with [employer] [facility]  [phone])

## 2012-10-20 DIAGNOSIS — F259 Schizoaffective disorder, unspecified: Secondary | ICD-10-CM

## 2012-10-20 NOTE — Progress Notes (Signed)
Patient with orders to be discharge home. Discharge instructions given, patient verbalized understanding. Patient in stable condition upon discharge. Patient escorted out via staff. Patient left in private vehicle with friend.

## 2012-10-20 NOTE — Discharge Summary (Signed)
Physician Discharge Summary  Harry Zhang ZOX:[id number] DOB: 1966/07/15 DOA: 10/19/2012    Admit date: 10/19/2012 Discharge date: 10/20/2012  Time spent: Greater than 30 minutes  Recommendations for Outpatient Follow-up:  1. Followup with cardiology.   Discharge Diagnoses:  1. Acute dyspnea with possible syncope, no clear etiology. No evidence of myocardial ischemia or infarction. No arrhythmias. 2. Sick sinus syndrome, status post pacemaker. 3. Schizoaffective disorder.   Discharge Condition: Stable.  Diet recommendation: Regular.  Filed Weights   10/19/12 1357 10/19/12 2043  Weight: 81.647 kg (180 lb) 74.299 kg (163 lb 12.8 oz)    History of present illness:  This 47 year old man presented to the hospital with a possible syncopal episode. Please see initial history as outlined below: HPI: Harry Zhang is a 47 y.o. male with a past medical history for sinus arrest status post pacemaker insertion 2004, GERD, and schizoaffective disorder with multiple psychiatric hospitalizations for suicidal who presented via EMS to the emergency department after he had a syncopal episode at home today following strenuous snow shoveling for several hours. He came inside because he was very SOB and "needed to rest". He had sudden SOB while he was lying down -just before he "fell asleep". He had actually gotten up and ran to the doorway because he said he felt like he could not get air and the house he was in "felt hot". His mother witnessed the event, he fell inside the doorway, hit his head on the wall and "twisted" his neck. He spontaneously regained consciousness, he does not recall the event. No recent illness, but he describes frequent episodes of night-time PND awakenings and orthopnea for the past few weeks. Denies any chest pain. He complains of feeling "very sore" he had a spasm in his left upper arm from shoveling the day prior. He complains now of a HA, and severe neck and upper back pain  and stiffness.   Hospital Course:  The patient was admitted overnight and observed. There were no arrhythmias. Serial cardiac enzymes were negative. He feels well now. His history was somewhat atypical for major pathological event for the degree of dyspnea that he described. I wonder if he was having an acute panic attack. He stable for discharge now but he clearly needs followup in cardiology clinic for assessment and interrogation of his pacemaker. The pacemaker was checked apparently yesterday and it was working.  Procedures:  None.   Consultations:  None.  Discharge Exam: Filed Vitals:   10/19/12 1713 10/19/12 1905 10/19/12 2043 10/20/12 0637  BP: 109/74 96/66 101/70 114/72  Pulse: 66  70 63  Temp:   98 F (36.7 C) 97.5 F (36.4 C)  TempSrc:   Oral Oral  Resp: 16 18 20 20   Height:   5\' 11"  (1.803 m)   Weight:   74.299 kg (163 lb 12.8 oz)   SpO2: 96% 97% 98% 99%    General: He looks systemically well. Cardiovascular: Heart sounds are present and normal in sinus rhythm. There are no murmurs. There is no gallop rhythm. There is no evidence of heart failure. Jugular venous pressure not elevated. Respiratory:  Lung fields clear. He is alert and orientated.  Discharge Instructions  Discharge Orders   Future Orders Complete By Expires     Diet - low sodium heart healthy  As directed     Increase activity slowly  As directed         Medication List    TAKE these medications  nitroGLYCERIN 0.4 MG SL tablet  Commonly known as:  NITROSTAT  Place 0.4 mg under the tongue every 5 (five) minutes as needed for chest pain.          The results of significant diagnostics from this hospitalization (including imaging, microbiology, ancillary and laboratory) are listed below for reference.    Significant Diagnostic Studies: Ct Head Wo Contrast  10/19/2012  *RADIOLOGY REPORT*  Clinical Data:  Status post fall; hit head, with headache and posterior neck pain.  CT HEAD  WITHOUT CONTRAST AND CT CERVICAL SPINE WITHOUT CONTRAST  Technique:  Multidetector CT imaging of the head and cervical spine was performed following the standard protocol without intravenous contrast.  Multiplanar CT image reconstructions of the cervical spine were also generated.  Comparison: Cervical spine radiographs performed 01/30/2006  CT HEAD  Findings: There is no evidence of acute infarction, mass lesion, or intra- or extra-axial hemorrhage on CT.  The posterior fossa, including the cerebellum, brainstem and fourth ventricle, is within normal limits.  The third and lateral ventricles, and basal ganglia are unremarkable in appearance.  The cerebral hemispheres are symmetric in appearance, with normal gray- white differentiation.  No mass effect or midline shift is seen.  There is no evidence of fracture; visualized osseous structures are unremarkable in appearance.  The orbits are within normal limits. The paranasal sinuses and mastoid air cells are well-aerated. Minimal soft tissue swelling is suggested at the vertex.  IMPRESSION:  1.  No evidence of traumatic intracranial injury or fracture. 2.  Minimal soft tissue swelling suggested at the vertex.  CT CERVICAL SPINE  Findings: There is no evidence of fracture or subluxation. Vertebral bodies demonstrate normal height and alignment. Mild multilevel disc space narrowing is noted along the cervical spine, with scattered anterior posterior disc osteophyte complexes node and multiple levels.  There may be mild chronic stenosis of the cervical spinal canal at C3-C4, measuring 7 mm in AP dimension. Prevertebral soft tissues are within normal limits.  The thyroid gland is unremarkable in appearance.  The visualized lung apices are clear.  No significant soft tissue abnormalities are seen.  IMPRESSION:  1.  No evidence of fracture or subluxation along the cervical spine. 2.  Mild degenerative change noted along the cervical spine; suggestion of mild chronic  stenosis of the cervical spinal canal at C3-C4, measuring 7 mm in AP dimension.  Some degree of stenosis was suggested in 2007 at additional levels, but this is not as noticeable on the current study.   Original Report Authenticated By: Tonia Ghent, M.D.    Ct Cervical Spine Wo Contrast  10/19/2012  *RADIOLOGY REPORT*  Clinical Data:  Status post fall; hit head, with headache and posterior neck pain.  CT HEAD WITHOUT CONTRAST AND CT CERVICAL SPINE WITHOUT CONTRAST  Technique:  Multidetector CT imaging of the head and cervical spine was performed following the standard protocol without intravenous contrast.  Multiplanar CT image reconstructions of the cervical spine were also generated.  Comparison: Cervical spine radiographs performed 01/30/2006  CT HEAD  Findings: There is no evidence of acute infarction, mass lesion, or intra- or extra-axial hemorrhage on CT.  The posterior fossa, including the cerebellum, brainstem and fourth ventricle, is within normal limits.  The third and lateral ventricles, and basal ganglia are unremarkable in appearance.  The cerebral hemispheres are symmetric in appearance, with normal gray- white differentiation.  No mass effect or midline shift is seen.  There is no evidence of fracture; visualized osseous structures are unremarkable  in appearance.  The orbits are within normal limits. The paranasal sinuses and mastoid air cells are well-aerated. Minimal soft tissue swelling is suggested at the vertex.  IMPRESSION:  1.  No evidence of traumatic intracranial injury or fracture. 2.  Minimal soft tissue swelling suggested at the vertex.  CT CERVICAL SPINE  Findings: There is no evidence of fracture or subluxation. Vertebral bodies demonstrate normal height and alignment. Mild multilevel disc space narrowing is noted along the cervical spine, with scattered anterior posterior disc osteophyte complexes node and multiple levels.  There may be mild chronic stenosis of the cervical spinal  canal at C3-C4, measuring 7 mm in AP dimension. Prevertebral soft tissues are within normal limits.  The thyroid gland is unremarkable in appearance.  The visualized lung apices are clear.  No significant soft tissue abnormalities are seen.  IMPRESSION:  1.  No evidence of fracture or subluxation along the cervical spine. 2.  Mild degenerative change noted along the cervical spine; suggestion of mild chronic stenosis of the cervical spinal canal at C3-C4, measuring 7 mm in AP dimension.  Some degree of stenosis was suggested in 2007 at additional levels, but this is not as noticeable on the current study.   Original Report Authenticated By: Tonia Ghent, M.D.    [facility] 1 View  10/19/2012  *RADIOLOGY REPORT*  Clinical Data: Chest pain.  PORTABLE CHEST - 1 VIEW  Comparison: 11/20/2010  Findings: Left pacer remains in stable position. Heart and mediastinal contours are within normal limits.  No focal opacities or effusions.  No acute bony abnormality.  IMPRESSION: No active cardiopulmonary disease.   Original Report Authenticated By: Charlett Nose, M.D.         Labs: Basic Metabolic Panel:  Recent Labs Lab 10/19/12 1447  NA 136  K 3.9  CL 101  CO2 26  GLUCOSE 92  BUN 14  CREATININE 0.98  CALCIUM 8.9       CBC:  Recent Labs Lab 10/19/12 1447  WBC 8.9  NEUTROABS 6.5  HGB 13.5  HCT 39.5  MCV 98.5  PLT 149*   Cardiac Enzymes:  Recent Labs Lab 10/19/12 1447 10/20/12 0848  TROPONINI <0.30 <0.30   BNP: BNP (last 3 results)  Recent Labs  10/19/12 1447  PROBNP 62.7         Signed:  GOSRANI,NIMISH C  [facility] 10/20/2012, 9:23 AM

## 2012-11-01 NOTE — Progress Notes (Signed)
UR Chart Review Completed  

## 2013-10-01 ENCOUNTER — Inpatient Hospital Stay (HOSPITAL_COMMUNITY)
Admission: EM | Admit: 2013-10-01 | Discharge: 2013-10-03 | DRG: 312 | Disposition: A | Discharge status: Home / Self Care | Payer: Self-pay | Attending: Internal Medicine | Admitting: Internal Medicine

## 2013-10-01 ENCOUNTER — Emergency Department (HOSPITAL_COMMUNITY): Payer: 59

## 2013-10-01 ENCOUNTER — Encounter (HOSPITAL_COMMUNITY): Payer: Self-pay | Admitting: Emergency Medicine

## 2013-10-01 DIAGNOSIS — I2510 Atherosclerotic heart disease of native coronary artery without angina pectoris: Secondary | ICD-10-CM | POA: Diagnosis present

## 2013-10-01 DIAGNOSIS — M25559 Pain in unspecified hip: Secondary | ICD-10-CM | POA: Diagnosis present

## 2013-10-01 DIAGNOSIS — K219 Gastro-esophageal reflux disease without esophagitis: Secondary | ICD-10-CM

## 2013-10-01 DIAGNOSIS — R55 Syncope and collapse: Principal | ICD-10-CM | POA: Diagnosis present

## 2013-10-01 DIAGNOSIS — Z950 Presence of cardiac pacemaker: Secondary | ICD-10-CM

## 2013-10-01 DIAGNOSIS — Z9181 History of falling: Secondary | ICD-10-CM

## 2013-10-01 DIAGNOSIS — F319 Bipolar disorder, unspecified: Secondary | ICD-10-CM | POA: Diagnosis present

## 2013-10-01 DIAGNOSIS — F259 Schizoaffective disorder, unspecified: Secondary | ICD-10-CM | POA: Diagnosis present

## 2013-10-01 DIAGNOSIS — S139XXA Sprain of joints and ligaments of unspecified parts of neck, initial encounter: Secondary | ICD-10-CM

## 2013-10-01 DIAGNOSIS — R51 Headache: Secondary | ICD-10-CM | POA: Diagnosis present

## 2013-10-01 DIAGNOSIS — R0600 Dyspnea, unspecified: Secondary | ICD-10-CM

## 2013-10-01 DIAGNOSIS — Z23 Encounter for immunization: Secondary | ICD-10-CM

## 2013-10-01 HISTORY — DX: Schizoaffective disorder, unspecified: F25.9

## 2013-10-01 HISTORY — DX: Syncope and collapse: R55

## 2013-10-01 HISTORY — DX: Depression, unspecified: F32.A

## 2013-10-01 HISTORY — DX: Bradycardia, unspecified: R00.1

## 2013-10-01 HISTORY — DX: Major depressive disorder, single episode, unspecified: F32.9

## 2013-10-01 HISTORY — DX: Suicidal ideations: R45.851

## 2013-10-01 LAB — RAPID URINE DRUG SCREEN, HOSP PERFORMED
Amphetamines: NOT DETECTED
BARBITURATES: NOT DETECTED
BENZODIAZEPINES: NOT DETECTED
COCAINE: NOT DETECTED
Opiates: NOT DETECTED
TETRAHYDROCANNABINOL: POSITIVE — AB

## 2013-10-01 LAB — URINALYSIS, ROUTINE W REFLEX MICROSCOPIC
BILIRUBIN URINE: NEGATIVE
Glucose, UA: NEGATIVE mg/dL
HGB URINE DIPSTICK: NEGATIVE
Leukocytes, UA: NEGATIVE
Nitrite: NEGATIVE
Protein, ur: NEGATIVE mg/dL
SPECIFIC GRAVITY, URINE: 1.01 (ref 1.005–1.030)
UROBILINOGEN UA: 0.2 mg/dL (ref 0.0–1.0)
pH: 6 (ref 5.0–8.0)

## 2013-10-01 LAB — BASIC METABOLIC PANEL
BUN: 15 mg/dL (ref 6–23)
CALCIUM: 9.5 mg/dL (ref 8.4–10.5)
CHLORIDE: 99 meq/L (ref 96–112)
CO2: 28 mEq/L (ref 19–32)
CREATININE: 0.92 mg/dL (ref 0.50–1.35)
GFR calc non Af Amer: >90 mL/min (ref >90)
Glucose, Bld: 87 mg/dL (ref 70–99)
Potassium: 4.5 mEq/L (ref 3.7–5.3)
Sodium: 139 mEq/L (ref 137–147)

## 2013-10-01 LAB — CBC WITH DIFFERENTIAL/PLATELET
BASOS ABS: 0 10*3/uL (ref 0.0–0.1)
BASOS PCT: 0 % (ref 0–1)
EOS ABS: 0.1 10*3/uL (ref 0.0–0.7)
EOS PCT: 1 % (ref 0–5)
HEMATOCRIT: 45 % (ref 39.0–52.0)
HEMOGLOBIN: 15.2 g/dL (ref 13.0–17.0)
Lymphocytes Relative: 32 % (ref 12–46)
Lymphs Abs: 2.3 10*3/uL (ref 0.7–4.0)
MCH: 33.3 pg (ref 26.0–34.0)
MCHC: 33.8 g/dL (ref 30.0–36.0)
MCV: 98.7 fL (ref 78.0–100.0)
MONO ABS: 0.7 10*3/uL (ref 0.1–1.0)
MONOS PCT: 9 % (ref 3–12)
NEUTROS ABS: 4.1 10*3/uL (ref 1.7–7.7)
Neutrophils Relative %: 57 % (ref 43–77)
Platelets: 186 10*3/uL (ref 150–400)
RBC: 4.56 MIL/uL (ref 4.22–5.81)
RDW: 12.7 % (ref 11.5–15.5)
WBC: 7.2 10*3/uL (ref 4.0–10.5)

## 2013-10-01 LAB — TROPONIN I: Troponin I: <0.3 ng/mL (ref <0.30)

## 2013-10-01 LAB — ETHANOL

## 2013-10-01 MED ORDER — SODIUM CHLORIDE 0.9 % IV SOLN
INTRAVENOUS | Status: AC
  Administered 2013-10-01: 19:00:00 via INTRAVENOUS

## 2013-10-01 MED ORDER — ACETAMINOPHEN 325 MG PO TABS
650.0000 mg | ORAL_TABLET | Freq: Four times a day (QID) | ORAL | Status: DC | PRN
  Administered 2013-10-01: 650 mg via ORAL
  Filled 2013-10-01: qty 2

## 2013-10-01 MED ORDER — PNEUMOCOCCAL VAC POLYVALENT 25 MCG/0.5ML IJ INJ
0.5000 mL | INJECTION | INTRAMUSCULAR | Status: AC
  Administered 2013-10-02: 0.5 mL via INTRAMUSCULAR
  Filled 2013-10-01: qty 0.5

## 2013-10-01 MED ORDER — INFLUENZA VAC SPLIT QUAD 0.5 ML IM SUSP
0.5000 mL | INTRAMUSCULAR | Status: AC
  Administered 2013-10-02: 0.5 mL via INTRAMUSCULAR
  Filled 2013-10-01: qty 0.5

## 2013-10-01 MED ORDER — ONDANSETRON HCL 4 MG/2ML IJ SOLN: 4.0000 mg | Freq: Four times a day (QID) | INTRAMUSCULAR | Status: DC | PRN

## 2013-10-01 MED ORDER — ACETAMINOPHEN 650 MG RE SUPP: 650.0000 mg | Freq: Four times a day (QID) | RECTAL | Status: DC | PRN

## 2013-10-01 MED ORDER — SODIUM CHLORIDE 0.9 % IJ SOLN
3.0000 mL | Freq: Two times a day (BID) | INTRAMUSCULAR | Status: DC
  Administered 2013-10-02 (×2): 3 mL via INTRAVENOUS

## 2013-10-01 MED ORDER — ONDANSETRON HCL 4 MG PO TABS: 4.0000 mg | ORAL_TABLET | Freq: Four times a day (QID) | ORAL | Status: DC | PRN

## 2013-10-01 NOTE — ED Notes (Signed)
Pt reports fall that occurred early this morning. Pt states "I don't remember anything but my mom says I fell 3 times". Pt states "I think I may have blacked out". Pt has pacemaker. Pt reports chest pain this morning.

## 2013-10-01 NOTE — ED Notes (Signed)
Pt. Reports multiple syncopal episodes this morning. Pt. Reports hitting his head. Small knot noted on forehead.

## 2013-10-01 NOTE — ED Provider Notes (Addendum)
CSN: [id number]     Arrival date & time 10/01/13  1255 History  This chart was scribed for Hilario Quarry, MD by Danella Maiers, ED Scribe. This patient was seen in room APA04/APA04 and the patient's care was started at 1:41 PM.     Chief Complaint  Patient presents with  . Near Syncope   Patient is a 48 y.o. male presenting with syncope. The history is provided by the patient. No language interpreter was used.  Loss of Consciousness Episode history:  Multiple Most recent episode:  Today Timing:  Intermittent Witnessed: yes   Relieved by:  None tried Worsened by:  Nothing tried Ineffective treatments:  None tried Risk factors: coronary artery disease    HPI Comments: Harry Zhang is a 48 y.o. male with a h/o CAD and pacemaker insertion who presents to the Emergency Department complaining of three episodes syncope this morning. Last episode was at 12:30pm. All episodes were witnessed by pt's mother. Pt cannot remember losing consciousness, but states he woke up on the floor. He has been able to ambulate since. Pt reports feeling fine last night and when he woke this morning. Pt reports lack of appetite recently and states he has not eaten today. He reports he hit his face on the floor when he fell. He also reports left hip pain. He states he also experienced LOC before he had his pacemaker put in but states that was much worse than this. He does not smoke or drink. Mother states about midnight she heard crashing noise and found patient on floor.  He was weak and attempted to walk but fell twice more.  Patient was confused.  He did not have lateralized weakness.  Patient was back at baseline when ambulance arrived.    Past Medical History  Diagnosis Date  . Coronary artery disease    Past Surgical History  Procedure Laterality Date  . Pacemaker insertion     History reviewed. No pertinent family history. History  Substance Use Topics  . Smoking status: Never Smoker   . Smokeless  tobacco: Not on file  . Alcohol Use: No    Review of Systems  Constitutional: Positive for appetite change.  Cardiovascular: Positive for syncope.  Musculoskeletal: Positive for arthralgias (left hip).  Skin: Positive for wound.  Neurological: Positive for syncope.  All other systems reviewed and are negative.    Allergies  Bee pollen  Home Medications  No current outpatient prescriptions on file. BP 128/85  Pulse 70  Temp(Src) 98.2 F (36.8 C)  Resp 18  SpO2 98% Physical Exam  Nursing note and vitals reviewed. Constitutional: He is oriented to person, place, and time. He appears well-developed and well-nourished.  HENT:  Head: Normocephalic and atraumatic.  Right Ear: External ear normal.  Left Ear: External ear normal.  Nose: Nose normal.  Mouth/Throat: Oropharynx is clear and moist.  Eyes: Conjunctivae and EOM are normal. Pupils are equal, round, and reactive to light.  Neck: Normal range of motion. Neck supple.  Cardiovascular: Normal rate, regular rhythm, normal heart sounds and intact distal pulses.   Pulmonary/Chest: Effort normal and breath sounds normal. No respiratory distress. He has no wheezes. He exhibits no tenderness.  Abdominal: Soft. Bowel sounds are normal. He exhibits no distension and no mass. There is no tenderness. There is no guarding.  Musculoskeletal: Normal range of motion.  tender over the left hip. Abrasion to the forehead.  Neurological: He is alert and oriented to person, place, and time. He  has normal reflexes. He exhibits normal muscle tone. Coordination normal.  Negative pronator drift.  Skin: Skin is warm and dry.  Psychiatric: He has a normal mood and affect. His behavior is normal. Judgment and thought content normal.    ED Course  Procedures (including critical care time) Medications - No data to display  DIAGNOSTIC STUDIES: Oxygen Saturation is 98% on RA, normal by my interpretation.    COORDINATION OF CARE: 1:54 PM-  Discussed treatment plan with pt. Pt agrees to plan.    Labs Review Labs Reviewed  CBC WITH DIFFERENTIAL  BASIC METABOLIC PANEL  TROPONIN I  ETHANOL  URINALYSIS, ROUTINE W REFLEX MICROSCOPIC  URINE RAPID DRUG SCREEN (HOSP PERFORMED)   Imaging Review Dg Hip Complete Left  10/01/2013   CLINICAL DATA:  Multiple syncopal episodes.  Pain in the left thigh.  EXAM: LEFT HIP - COMPLETE 2+ VIEW  COMPARISON:  None.  FINDINGS: Pelvic bony ring is intact. Calcifications in the pelvis are suggestive for phleboliths. Nonspecific bowel gas pattern. Left hip is located without a fracture. Sclerosis in the left superior acetabulum is consistent with mild degenerative changes.  IMPRESSION: Mild degenerative changes in the left hip. No acute bone abnormality.   Electronically Signed   By: Richarda Overlie M.D.   On: 10/01/2013 15:12   Ct Head Wo Contrast  10/01/2013   CLINICAL DATA:  Syncope/trauma/ headache.  Fall.  EXAM: CT HEAD WITHOUT CONTRAST  TECHNIQUE: Contiguous axial images were obtained from the base of the skull through the vertex without intravenous contrast.  COMPARISON:  10/19/2012  FINDINGS: Ventricles, cisterns and other CSF spaces are within normal. There is no mass, mass effect, shift of midline structures or acute hemorrhage. There is no evidence to suggest acute infarction. Remaining bones and soft tissues are within normal.  IMPRESSION: No acute intracranial findings.   Electronically Signed   By: Elberta Fortis M.D.   On: 10/01/2013 15:24   Ct Cervical Spine Wo Contrast  10/01/2013   CLINICAL DATA:  Head trauma secondary to syncope and a fall.  Pain.  EXAM: CT CERVICAL SPINE WITHOUT CONTRAST  TECHNIQUE: Multidetector CT imaging of the cervical spine was performed without intravenous contrast. Multiplanar CT image reconstructions were also generated.  COMPARISON:  CT scan dated 10/19/2012  FINDINGS: There is no acute fracture or subluxation or prevertebral soft tissue swelling. There is multilevel  degenerative disc disease with small broad-based disc protrusions at C3-4 through C6-7 with narrowing of the AP dimension of the spinal canal at multiple levels to a minimum AP dimension of 6 mm at C4-5 and C5-6. This is unchanged. No facet arthritis.  IMPRESSION: No acute abnormalities of the cervical spine. Multilevel degenerative disc disease.   Electronically Signed   By: Geanie Cooley M.D.   On: 10/01/2013 15:49    EKG Interpretation   None       MDM  No diagnosis found. 4:05 PM CT results reviewed and cervical collar removed.  Patient awaiting pacemaker interrogation.   Patient with pacemaker reportedly not previously set up for interrogation so no report available.    48 year old male history of pacemaker placement presents today with episodes of decreased level of consciousness earlier today.  It is unclear whether this was syncope or new onset seizure. Head CT, cervical spine, and left hip x-Enyah Moman were obtained do to signs of trauma and are negative. Plan observation overnight the patient can have further cardiology consult and observation.  Discussed with Dr. Elisabeth Pigeon and plan observation.  Cardiology consult placed.   I personally performed the services described in this documentation, which was scribed in my presence. The recorded information has been reviewed and considered.   Hilario Quarryanielle S Sabriyah Wilcher, MD 10/01/13 1800  Discussed with Dr. Ulyses Southwardroitero for cardiology.    I personally performed the services described in this documentation, which was scribed in my presence. The recorded information has been reviewed and considered.   Hilario Quarryanielle S Nuriya Stuck, MD 10/01/13 [phone]

## 2013-10-01 NOTE — ED Notes (Signed)
Spoke with Harry SprinklesSandy Zhang, with [employer] [facility] [employer] in regards to pacemaker interrogation - states will be here in approx 1 hr to interrogate.  The pacemaker is not compatible with transmitter located in ED.  edp notified.

## 2013-10-01 NOTE — H&P (Addendum)
[facility] History and Physical  Harry Zhang:[id number] DOB: 03/17/1966 DOA: 10/01/2013  Referring physician: ER physician PCP: No PCP Per Patient   Chief Complaint: syncope  HPI:  48 year old male with past medical history of CAD and pacemaker placement who presented to [facility] ED 10/01/2013 with syncopal episode at home. Initially, pt was found on the floor by family member and pt has no recollection of events that led to the fall. On attempt to stand up pt fell 2 additional times. Per family, there was no report of seizure like activity. No chest pain, no palpitations, no shortness of breath. No fevers or chills. No abdominal pain, no  Nausea or vomiting. No blood in stool or urine. In ED< vitals are stable and blood work is unremarkable. Pacemaker was interrogated. The 12 lead EKG did not show acute ischemic changes. CT head and CT cervical spine did not reveal acute intracranial findings. Left hip x ray did not reveal acute fracture.  Assessment and Plan:  Active Problems:   Syncope - will cycle cardiac enzymes x 3 sets - pacemaker interrogated in ED - CT head and cervical spine did not reveal acute fractures or acute intracranial findings - obtain 2 D ECHO - appreciate cardiology consult and recommendation  - PT eval in am - may give IV fluids for first 12 hours - repeat admission labs   Radiological Exams on Admission: Dg Hip Complete Left 10/01/2013    IMPRESSION: Mild degenerative changes in the left hip. No acute bone abnormality.   Electronically Signed   By: Richarda Overlie M.D.   On: 10/01/2013 15:12   Ct Head Wo Contrast 10/01/2013   IMPRESSION: No acute intracranial findings.   Electronically Signed   By: Elberta Fortis M.D.   On: 10/01/2013 15:24   Ct Cervical Spine Wo Contrast 10/01/2013    IMPRESSION: No acute abnormalities of the cervical spine. Multilevel degenerative disc disease.   Electronically Signed   By: Geanie Cooley M.D.   On: 10/01/2013 15:49     Code Status: Full Family Communication: Pt at bedside Disposition Plan: Admit for further evaluation  Manson Passey, MD  [facility] Pager [phone]  Review of Systems:  Constitutional: Negative for fever, chills and malaise/fatigue. Negative for diaphoresis.  HENT: Negative for hearing loss, ear pain, nosebleeds, congestion, sore throat, neck pain, tinnitus and ear discharge.   Eyes: Negative for blurred vision, double vision, photophobia, pain, discharge and redness.  Respiratory: Negative for cough, hemoptysis, sputum production, shortness of breath, wheezing and stridor.   Cardiovascular: Negative for chest pain, palpitations, orthopnea, claudication and leg swelling.  Gastrointestinal: Negative for nausea, vomiting and abdominal pain. Negative for heartburn, constipation, blood in stool and melena.  Genitourinary: Negative for dysuria, urgency, frequency, hematuria and flank pain.  Musculoskeletal: Negative for myalgias, back pain, joint pain and falls.  Skin: Negative for itching and rash.  Neurological: per HPI Endo/Heme/Allergies: Negative for environmental allergies and polydipsia. Does not bruise/bleed easily.  Psychiatric/Behavioral: Negative for suicidal ideas. The patient is not nervous/anxious.      Past Medical History  Diagnosis Date  . Coronary artery disease    Past Surgical History  Procedure Laterality Date  . Pacemaker insertion     Social History:  reports that he has never smoked. He does not have any smokeless tobacco history on file. He reports that he does not drink alcohol or use illicit drugs.  Allergies  Allergen Reactions  . Bee Pollen Anaphylaxis  Family History: hypothyroidism family history  Prior to Admission medications   Not on File   Physical Exam: Filed Vitals:   10/01/13 1355 10/01/13 1702 10/01/13 1730 10/01/13 1800  BP: 131/79 129/92 126/81 128/87  Pulse: 76 71 65 69  Temp:      Resp:  18 15 18   SpO2:  97% 97% 97%     Physical Exam  Constitutional: Appears well-developed and well-nourished. No distress.  HENT: Normocephalic. External right and left ear normal.abrasion on forehead Eyes: Conjunctivae and EOM are normal. PERRLA, no scleral icterus.  Neck: Normal ROM. Neck supple. No JVD. No tracheal deviation. No thyromegaly.  CVS: RRR, S1/S2 appreciated Pulmonary: Effort and breath sounds normal, no stridor Abdominal: Soft. BS +,  no distension, tenderness, rebound or guarding.  Musculoskeletal: Normal range of motion. No edema and no tenderness.  Lymphadenopathy: No lymphadenopathy noted, cervical, inguinal. Neuro: Alert. No focal neurologic deficits  Skin: Skin is warm and dry.   Psychiatric: Normal mood and affect. Behavior, judgment, thought content normal.   Labs on Admission:  Basic Metabolic Panel:  Recent Labs Lab 10/01/13 1356  NA 139  K 4.5  CL 99  CO2 28  GLUCOSE 87  BUN 15  CREATININE 0.92  CALCIUM 9.5   Liver Function Tests: No results found for this basename: AST, ALT, ALKPHOS, BILITOT, PROT, ALBUMIN,  in the last 168 hours No results found for this basename: LIPASE, AMYLASE,  in the last 168 hours No results found for this basename: AMMONIA,  in the last 168 hours CBC:  Recent Labs Lab 10/01/13 1356  WBC 7.2  NEUTROABS 4.1  HGB 15.2  HCT 45.0  MCV 98.7  PLT 186   Cardiac Enzymes:  Recent Labs Lab 10/01/13 1356  TROPONINI <0.30   BNP: No components found with this basename: POCBNP,  CBG: No results found for this basename: GLUCAP,  in the last 168 hours  If 7PM-7AM, please contact night-coverage [email or web address] Password [email or web address] 10/01/2013, 6:11 PM

## 2013-10-02 ENCOUNTER — Encounter (HOSPITAL_COMMUNITY): Payer: Self-pay | Admitting: Adult Health

## 2013-10-02 DIAGNOSIS — S139XXA Sprain of joints and ligaments of unspecified parts of neck, initial encounter: Secondary | ICD-10-CM

## 2013-10-02 LAB — COMPREHENSIVE METABOLIC PANEL
ALBUMIN: 3.7 g/dL (ref 3.5–5.2)
ALK PHOS: 102 U/L (ref 39–117)
ALT: 11 U/L (ref 0–53)
AST: 22 U/L (ref 0–37)
BILIRUBIN TOTAL: 0.7 mg/dL (ref 0.3–1.2)
BUN: 13 mg/dL (ref 6–23)
CHLORIDE: 102 meq/L (ref 96–112)
CO2: 27 mEq/L (ref 19–32)
Calcium: 8.9 mg/dL (ref 8.4–10.5)
Creatinine, Ser: 0.96 mg/dL (ref 0.50–1.35)
GFR calc Af Amer: >90 mL/min (ref >90)
GFR calc non Af Amer: >90 mL/min (ref >90)
GLUCOSE: 82 mg/dL (ref 70–99)
Potassium: 4 mEq/L (ref 3.7–5.3)
Sodium: 140 mEq/L (ref 137–147)
Total Protein: 7 g/dL (ref 6.0–8.3)

## 2013-10-02 LAB — CBC
HCT: 43.4 % (ref 39.0–52.0)
Hemoglobin: 14.9 g/dL (ref 13.0–17.0)
MCH: 33.9 pg (ref 26.0–34.0)
MCHC: 34.3 g/dL (ref 30.0–36.0)
MCV: 98.6 fL (ref 78.0–100.0)
PLATELETS: 174 10*3/uL (ref 150–400)
RBC: 4.4 MIL/uL (ref 4.22–5.81)
RDW: 12.8 % (ref 11.5–15.5)
WBC: 6.9 10*3/uL (ref 4.0–10.5)

## 2013-10-02 LAB — GLUCOSE, CAPILLARY: Glucose-Capillary: 159 mg/dL — ABNORMAL HIGH (ref 70–99)

## 2013-10-02 LAB — TROPONIN I: Troponin I: <0.3 ng/mL (ref <0.30)

## 2013-10-02 MED ORDER — LIDOCAINE HCL (PF) 1 % IJ SOLN
INTRAMUSCULAR | Status: AC
  Filled 2013-10-02: qty 5

## 2013-10-02 NOTE — BHH Counselor (Signed)
Writer attempting to assess this patient. Unfortunately the machine is not connecting (possble WIFI issue). [name] nurse will try to work on making arrangements for patient to be assessed. [name] nurse will call back .

## 2013-10-02 NOTE — Progress Notes (Addendum)
[facility] PROGRESS NOTE  Harry ManisLeonard A Hendry ZOX:[id number] DOB: 18-Mar-1966 DOA: 10/01/2013 PCP: No PCP Per Patient  Brief narrative: 48 year old male with past medical history of CAD and pacemaker placement who presented to [facility] ED 10/01/2013 with syncopal episode at home. Initially, pt was found on the floor by family member and pt has no recollection of events that led to the fall. On attempt to stand up pt fell 2 additional times.  In ED, vitals were stable and blood work is unremarkable. Pacemaker was interrogated. The 12 lead EKG did not show acute ischemic changes. CT head and CT cervical spine did not reveal acute intracranial findings. Left hip x ray did not reveal acute fracture.   Assessment and Plan:   Active Problems:  Syncope  - unclear etiology - UDS positive for THC but he denies substance abuse - cardiac enzymes negative - follow up 2 D E CHO - pacemaker interrogated in ED, functioning normal - CT head and cervical spine did not reveal acute fractures or acute intracranial findings  - appreciate cardiology recommendations  Active Problems: H/O bipolar disorder - not on any meds now but used to be on few psych meds - Appreciated psych consult  Code Status: full code Family Communication: no family at the bedside  Disposition Plan: home when stable  Manson PasseyEVINE, Olliver Boyadjian, MD  [facility] Pager [phone]  If 7PM-7AM, please contact night-coverage [email or web address] Password [email or web address] 10/02/2013, 12:35 PM   LOS: 1 day   Consultants:  Cardiology   Psychiatry   Procedures:  None   Antibiotics:  None   HPI/Subjective: No acute overnight events.   Objective: Filed Vitals:   10/01/13 1949 10/01/13 2004 10/02/13 0521 10/02/13 0615  BP: 126/81 123/65 96/56   Pulse: 73 59 60   Temp: 98.6 F (37 C) 97.3 F (36.3 C) 97.5 F (36.4 C)   TempSrc: Oral Oral Oral   Resp: 18 18 18    Height:      Weight:    75 kg (165 lb 5.5 oz)  SpO2: 96% 96% 97%      Intake/Output Summary (Last 24 hours) at 10/02/13 1235 Last data filed at 10/02/13 0532  Gross per 24 hour  Intake      0 ml  Output   1400 ml  Net  -1400 ml    Exam:   General:  Pt is alert, follows commands appropriately, not in acute distress  Cardiovascular: Regular rate and rhythm, S1/S2 appreciated  Respiratory: Clear to auscultation bilaterally, no wheezing, no crackles, no rhonchi  Abdomen: Soft, non tender, non distended, bowel sounds present, no guarding  Extremities: No edema, pulses DP and PT palpable bilaterally  Neuro: Grossly nonfocal  Data Reviewed: Basic Metabolic Panel:  Recent Labs Lab 10/01/13 1356 10/02/13 0631  NA 139 140  K 4.5 4.0  CL 99 102  CO2 28 27  GLUCOSE 87 82  BUN 15 13  CREATININE 0.92 0.96  CALCIUM 9.5 8.9   Liver Function Tests:  Recent Labs Lab 10/02/13 0631  AST 22  ALT 11  ALKPHOS 102  BILITOT 0.7  PROT 7.0  ALBUMIN 3.7   No results found for this basename: LIPASE, AMYLASE,  in the last 168 hours No results found for this basename: AMMONIA,  in the last 168 hours CBC:  Recent Labs Lab 10/01/13 1356 10/02/13 0631  WBC 7.2 6.9  NEUTROABS 4.1  --   HGB 15.2 14.9  HCT 45.0 43.4  MCV 98.7 98.6  PLT 186  174   Cardiac Enzymes:  Recent Labs Lab 10/01/13 1356 10/01/13 1924 10/02/13 0002 10/02/13 0632  TROPONINI <0.30 <0.30 <0.30 <0.30   BNP: No components found with this basename: POCBNP,  CBG:  Recent Labs Lab 10/02/13 0752  GLUCAP 159*    No results found for this or any previous visit (from the past 240 hour(s)).   Studies: Dg Hip Complete Left  10/01/2013   CLINICAL DATA:  Multiple syncopal episodes.  Pain in the left thigh.  EXAM: LEFT HIP - COMPLETE 2+ VIEW  COMPARISON:  None.  FINDINGS: Pelvic bony ring is intact. Calcifications in the pelvis are suggestive for phleboliths. Nonspecific bowel gas pattern. Left hip is located without a fracture. Sclerosis in the left superior acetabulum  is consistent with mild degenerative changes.  IMPRESSION: Mild degenerative changes in the left hip. No acute bone abnormality.   Electronically Signed   By: Richarda Overlie M.D.   On: 10/01/2013 15:12   Ct Head Wo Contrast  10/01/2013   CLINICAL DATA:  Syncope/trauma/ headache.  Fall.  EXAM: CT HEAD WITHOUT CONTRAST  TECHNIQUE: Contiguous axial images were obtained from the base of the skull through the vertex without intravenous contrast.  COMPARISON:  10/19/2012  FINDINGS: Ventricles, cisterns and other CSF spaces are within normal. There is no mass, mass effect, shift of midline structures or acute hemorrhage. There is no evidence to suggest acute infarction. Remaining bones and soft tissues are within normal.  IMPRESSION: No acute intracranial findings.   Electronically Signed   By: Elberta Fortis M.D.   On: 10/01/2013 15:24   Ct Cervical Spine Wo Contrast  10/01/2013   CLINICAL DATA:  Head trauma secondary to syncope and a fall.  Pain.  EXAM: CT CERVICAL SPINE WITHOUT CONTRAST  TECHNIQUE: Multidetector CT imaging of the cervical spine was performed without intravenous contrast. Multiplanar CT image reconstructions were also generated.  COMPARISON:  CT scan dated 10/19/2012  FINDINGS: There is no acute fracture or subluxation or prevertebral soft tissue swelling. There is multilevel degenerative disc disease with small broad-based disc protrusions at C3-4 through C6-7 with narrowing of the AP dimension of the spinal canal at multiple levels to a minimum AP dimension of 6 mm at C4-5 and C5-6. This is unchanged. No facet arthritis.  IMPRESSION: No acute abnormalities of the cervical spine. Multilevel degenerative disc disease.   Electronically Signed   By: Geanie Cooley M.D.   On: 10/01/2013 15:49    Scheduled Meds: . sodium chloride  3 mL Intravenous Q12H   Continuous Infusions:

## 2013-10-02 NOTE — BH Assessment (Signed)
Patient called patient's nurse to notify her that this TTS is ready to assess patient. Writer asked that tele assessment machine is placed in patient's room.   Writer also asked patient's nurse for the patient's physicians contact number to obtain clinical information prior to seeing this patient. [name] nurse did not know the physicians number at the time of this call but will contact this writer back with that information @ [phone].
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
## 2013-10-02 NOTE — Consult Note (Signed)
CARDIOLOGY CONSULT NOTE   Patient ID: Harry Zhang MRN: [id number] DOB/AGE: [birth year] 48 y.o.  Admit Date: 10/01/2013 Referring Physician: [facility] Primary Physician: No PCP Per Patient Consulting Cardiologist: Dina Rich MD Primary Cardiologist: [facility] Seen in [place] Reason for Consultation: Syncope  Clinical Summary Harry Zhang is a 48 y.o.male admitted with syncopal episode with head injury with known history of syncopal episodes in the past with cardiac work-up negative for cardiac etiology. He states that he fell 3 times on his way to bed. He injured his forehead but no lacerations.     He has a history of PPM implantation in [place] in 2005, schizophrenic affective disorder. Cardiac cath was completed in 2008 with normal coronaries. He was not found to be orthostatic. UDS positive of THC. He denies use of this.       He states due to financial issues and lack of Medicaid he has not been taking any of his medications for several years. He has not followed up for pacemaker checks.    He denies palpitations, headache, dyspnea or chest pain.  Allergies  Allergen Reactions  . Bee Pollen Anaphylaxis    Medications Scheduled Medications: . influenza vac split quadrivalent PF  0.5 mL Intramuscular Tomorrow-1000  . pneumococcal 23 valent vaccine  0.5 mL Intramuscular Tomorrow-1000  . sodium chloride  3 mL Intravenous Q12H    Infusions:    PRN Medications: acetaminophen, acetaminophen, ondansetron (ZOFRAN) IV, ondansetron   Past Medical History  Diagnosis Date  . Coronary artery disease     Normal Coronaris per cath 2008  . Bradycardia     Status post pacemaker implantation with St. Jude identity [id number]  . Depression     Psychiatric Admssion in 2012  . Suicidal ideation     Psychiatric Admission in 2012  . Syncope   . Schizoaffective disorder     Past Surgical History  Procedure Laterality Date  . Pacemaker insertion      History  reviewed. No pertinent family history.  Social History Harry Zhang reports that he has been smoking Cigarettes.  He has been smoking about 0.00 packs per day. He does not have any smokeless tobacco history on file. Harry Zhang reports that he does not drink alcohol.  Review of Systems Otherwise reviewed and negative except as outlined.  Physical Examination Blood pressure 96/56, pulse 60, temperature 97.5 F (36.4 C), temperature source Oral, resp. rate 18, height 5\' 11"  (1.803 m), weight 165 lb 5.5 oz (75 kg), SpO2 97.00%.  Intake/Output Summary (Last 24 hours) at 10/02/13 0915 Last data filed at 10/02/13 0532  Gross per 24 hour  Intake      0 ml  Output   1400 ml  Net  -1400 ml    Telemetry:  HEENT: Conjunctiva and lids normal, oropharynx clear with moist mucosa. Neck: Supple, no elevated JVP or carotid bruits, no thyromegaly. Lungs: Clear to auscultation, nonlabored breathing at rest. Cardiac: Regular rate and rhythm, no S3 or significant systolic murmur, no pericardial rub. Abdomen: Soft, nontender, no hepatomegaly, bowel sounds present, no guarding or rebound. Extremities: No pitting edema, distal pulses 2+. Skin: Warm and dry. Musculoskeletal: No kyphosis. Neuropsychiatric: Alert and oriented x3, affect grossly appropriate.  Prior Cardiac Testing/Procedures  1. Cardiac cath 05/2007 IMPRESSION:  1. Normal left ventricular function.  2. Normal coronary arteries.  3. History of atrioventricular sequential pacemaker insertion in  January 2007, in [place].  Lab Results  Basic Metabolic Panel:  Recent Labs  Lab 10/01/13 1356 10/02/13 0631  NA 139 140  K 4.5 4.0  CL 99 102  CO2 28 27  GLUCOSE 87 82  BUN 15 13  CREATININE 0.92 0.96  CALCIUM 9.5 8.9    Liver Function Tests:  Recent Labs Lab 10/02/13 0631  AST 22  ALT 11  ALKPHOS 102  BILITOT 0.7  PROT 7.0  ALBUMIN 3.7    CBC:  Recent Labs Lab 10/01/13 1356 10/02/13 0631  WBC 7.2 6.9  NEUTROABS  4.1  --   HGB 15.2 14.9  HCT 45.0 43.4  MCV 98.7 98.6  PLT 186 174    Cardiac Enzymes:  Recent Labs Lab 10/01/13 1356 10/01/13 1924 10/02/13 0002 10/02/13 0632  TROPONINI <0.30 <0.30 <0.30 <0.30    BNP: 62.7   Radiology: Dg Hip Complete Left  10/01/2013   CLINICAL DATA:  Multiple syncopal episodes.  Pain in the left thigh.  EXAM: LEFT HIP - COMPLETE 2+ VIEW  COMPARISON:  None.  FINDINGS: Pelvic bony ring is intact. Calcifications in the pelvis are suggestive for phleboliths. Nonspecific bowel gas pattern. Left hip is located without a fracture. Sclerosis in the left superior acetabulum is consistent with mild degenerative changes.  IMPRESSION: Mild degenerative changes in the left hip. No acute bone abnormality.   Electronically Signed   By: Richarda Overlie M.D.   On: 10/01/2013 15:12   Ct Head Wo Contrast  10/01/2013   CLINICAL DATA:  Syncope/trauma/ headache.  Fall.  EXAM: CT HEAD WITHOUT CONTRAST  TECHNIQUE: Contiguous axial images were obtained from the base of the skull through the vertex without intravenous contrast.  COMPARISON:  10/19/2012  FINDINGS: Ventricles, cisterns and other CSF spaces are within normal. There is no mass, mass effect, shift of midline structures or acute hemorrhage. There is no evidence to suggest acute infarction. Remaining bones and soft tissues are within normal.  IMPRESSION: No acute intracranial findings.   Electronically Signed   By: Elberta Fortis M.D.   On: 10/01/2013 15:24   Ct Cervical Spine Wo Contrast  10/01/2013   CLINICAL DATA:  Head trauma secondary to syncope and a fall.  Pain.  EXAM: CT CERVICAL SPINE WITHOUT CONTRAST  TECHNIQUE:  IMPRESSION: No acute abnormalities of the cervical spine. Multilevel degenerative disc disease.   Electronically Signed   By: Geanie Cooley M.D.   On: 10/01/2013 15:49     ECG: NSR no ACS   Impression and Recommendations  1.Syncopal Episodes: He has had history of syncope in the past. Non-cardiac etiology at  that time. No clear etiology found at this time as well. He has not been taking medications for years, nor had he had St. Jude Pacemaker interrogated in years. It was interrogated in ER on 10/01/2013 and was not found to have any arrhythmias or pacemaker malfunction.He is not orthostatic, but is slightly hypotensive. Echocardiogram is ordered.  2. Schizoaffective Disorder: He appears manic today. He has not been on any psych medications. He may need psych consult.   3. Drug Abuse: He denies use, but UDS is positive for THC. Uncertain if he was high when he fell.   Signed: Bettey Mare. Lyman Bishop NP [facility] 10/02/2013, 9:15 AM Co-Sign MD   Attending Note 48 yo male history of bradycardia with prior pacemaker placement, depression, schizoaffective disorder, and suicidal ideation admitted with syncope. UDS + for THC, negative orthostatics. He has been off his medications due to finanicial issues for quite some time and has not has regular pacemaker  checks. 10/2010 echo LVEF 55-60%, no WMA, no significant valvular. Cath 2008 with patent coronaries.  EKG normal sinus rhythm, pacer interrogated with normal function and no events. Trop negative. CT head negative. Telemetry shows atrial paced rhythm. At this point no evidence of cardiac etiology for his syncope. Will follow up echo.   Dina RichJonathan Normon Pettijohn MD
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
## 2013-10-02 NOTE — Evaluation (Signed)
Physical Therapy Evaluation Patient Details Name: Harry ManisLeonard A Sykora MRN: [id number] DOB: [birth year] Today's Date: 10/02/2013 Time: [id number] PT Time Calculation (min): 20 min  PT Assessment / Plan / Recommendation History of Present Illness  Pt is admitted due to syncope resulting in a fall.  He has cardiac disease with a pacemaker.  He lives with his mother and had been independent with all ADLs PTA.  Clinical Impression  Pt was seen for evaluation and not found to have any problems with mobility.  No PT intervention is needed.    PT Assessment  Patent does not need any further PT services    Follow Up Recommendations  No PT follow up    Does the patient have the potential to tolerate intense rehabilitation      Barriers to Discharge        Equipment Recommendations  None recommended by PT    Recommendations for Other Services     Frequency      Precautions / Restrictions Precautions Precautions: None Restrictions Weight Bearing Restrictions: No   Pertinent Vitals/Pain       Mobility  Bed Mobility Overal bed mobility: Independent Transfers Overall transfer level: Independent Equipment used: None Ambulation/Gait Ambulation/Gait assistance: Independent Ambulation Distance (Feet): 175 Feet Assistive device: None Gait Pattern/deviations: WFL(Within Functional Limits) Gait velocity interpretation: at or above normal speed for age/gender General Gait Details: No instability or syncope with gait.    Exercises     PT Diagnosis:    PT Problem List:   PT Treatment Interventions:       PT Goals(Current goals can be found in the care plan section) Acute Rehab PT Goals PT Goal Formulation: No goals set, d/c therapy  Visit Information  Last PT Received On: 10/02/13 History of Present Illness: Pt is admitted due to syncope resulting in a fall.  He has cardiac disease with a pacemaker.  He lives with his mother and had been independent with all ADLs PTA.        Prior Functioning  Home Living Family/patient expects to be discharged to:: Private residence Living Arrangements: Parent Available Help at Discharge: Family;Available 24 hours/day Type of Home: House Home Access: Stairs to enter [employer] Corporation[employer]trance Stairs-Number of Steps: 2 Home Layout: One level Home Equipment: None Prior Function Level of Independence: Independent Communication Communication: No difficulties    Cognition  Cognition Arousal/Alertness: Awake/alert Behavior During Therapy: WFL for tasks assessed/performed Overall Cognitive Status: Within Functional Limits for tasks assessed    Extremity/Trunk Assessment Lower Extremity Assessment Lower Extremity Assessment: Overall WFL for tasks assessed Cervical / Trunk Assessment Cervical / Trunk Assessment: Normal   Balance Balance Overall balance assessment: Independent  End of Session PT - End of Session Equipment Utilized During Treatment: Gait belt Activity Tolerance: Patient tolerated treatment well Patient left: in bed;with call bell/phone within reach;with bed alarm set  GP Functional Assessment Tool Used: clinical judgement Functional Limitation: Mobility: Walking and moving around Mobility: Walking and Moving Around Current Status (Y7829(G8978): 0 percent impaired, limited or restricted Mobility: Walking and Moving Around Goal Status (F6213(G8979): 0 percent impaired, limited or restricted Mobility: Walking and Moving Around Discharge Status [id number](G8980): 0 percent impaired, limited or restricted   Myrlene BrokerBrown, Dashonda Bonneau L 10/02/2013, 11:16 AM

## 2013-10-02 NOTE — Progress Notes (Signed)
UR completed. Patient changed to inpatient- admitted for syncope and has a cardiac history.

## 2013-10-02 NOTE — Progress Notes (Signed)
Nutrition Brief Note  Patient identified on the Malnutrition Screening Tool (MST) Report  Wt Readings from Last 15 Encounters:  10/02/13 165 lb 5.5 oz (75 kg)  10/19/12 163 lb 12.8 oz (74.299 kg)    Body mass index is 23.07 kg/(m^2). Patient meets criteria for normal weight based on current BMI.   Current diet order is Heart Healthy, patient is consuming approximately n/a% of meals at this time. Labs and medications reviewed.   No nutrition interventions warranted at this time. If nutrition issues arise, please consult RD.   Champion Corales A. Mayford KnifeWilliams, RD, LDN Pager: [phone]

## 2013-10-03 DIAGNOSIS — I369 Nonrheumatic tricuspid valve disorder, unspecified: Secondary | ICD-10-CM

## 2013-10-03 LAB — GLUCOSE, CAPILLARY: Glucose-Capillary: 120 mg/dL — ABNORMAL HIGH (ref 70–99)

## 2013-10-03 NOTE — Progress Notes (Signed)
No evidence of cardiogenic syncope at this time. Normal functioning pacemaker with no brady or tachyarrythmias. Telemetry reviewed, sinus rhythm with intermittent pacing. Will follow up results of echo, otherwise no further cardiac testing or interventions planned.   Dina RichJonathan Branch MD

## 2013-10-03 NOTE — Progress Notes (Signed)
*  PRELIMINARY RESULTS* Echocardiogram 2D Echocardiogram has been performed.  Audwin Semper 10/03/2013, 10:07 AM

## 2013-10-03 NOTE — Progress Notes (Signed)
AVS reviewed with patient.  Patient verbalized understanding of physician follow-up appointments with health clinics in [place] and [place].  Patient educated that he will need to follow-up with health clinics for referral to [facility].  Patient's IV removed.  Site WNL.  Patient reports all belongings intact and in possession at time of discharge.  Patient transported by RN via w/c to main entrance for discharge.  Patient stable at time of discharge.

## 2013-10-03 NOTE — Discharge Summary (Signed)
Physician Discharge Summary  Harry Zhang UEA:[id number] DOB: Apr 23, 1966 DOA: 10/01/2013  PCP: No PCP Per Patient  Admit date: 10/01/2013 Discharge date: 10/03/2013  Recommendations for Outpatient Follow-up:  Please follow up in our clinic per scheduled appt; see in follow up section. Once you establish PCP we can provide psychiatry referral for you  Who can then follow up with you and make recommendations for medications if any needed Pacemaker functioning well, may follow up with cardio per recommendations from cardio.  Discharge Diagnoses:  Active Problems:   Syncope   Syncope and collapse    Discharge Condition: medically stable for discharge home today   Diet recommendation: heart healthy   History of present illness:  48 year old male with past medical history of CAD and pacemaker placement who presented to [facility] ED 10/01/2013 with syncopal episode at home. Initially, pt was found on the floor by family member and pt has no recollection of events that led to the fall. On attempt to stand up pt fell 2 additional times.  In ED, vitals were stable and blood work is unremarkable. Pacemaker was interrogated. The 12 lead EKG did not show acute ischemic changes. CT head and CT cervical spine did not reveal acute intracranial findings. Left hip x ray did not reveal acute fracture.   Assessment and Plan:   Active Problems:  Syncope  - unclear etiology  - UDS positive for THC but he denies substance abuse  - cardiac enzymes negative  - 2 D ECHO on this admission showed normal EF  - pacemaker interrogated in ED, functioning normal  - CT head and cervical spine did not reveal acute fractures or acute intracranial findings  - appreciate cardiology recommendations on this admission  Active Problems:  H/O bipolar disorder  - not on any meds now but used to be on few psych meds  - Appreciated psych consult; no new recommendations at this time; to follow up outpt - no suicidal  ideations at this time  Code Status: full code  Family Communication: no family at the bedside   Consultants:  Cardiology  Psychiatry  Procedures:  None  Antibiotics:  None    Signed:  Manson Passey, MD  [facility] 10/03/2013, 11:50 AM  Pager #: [phone]  Discharge Exam: Filed Vitals:   10/03/13 0550  BP: 128/80  Pulse: 85  Temp: 98.6 F (37 C)  Resp: 20   Filed Vitals:   10/02/13 0521 10/02/13 0615 10/02/13 1943 10/03/13 0550  BP: 96/56  123/78 128/80  Pulse: 60  85 85  Temp: 97.5 F (36.4 C)  98.4 F (36.9 C) 98.6 F (37 C)  TempSrc: Oral  Oral Oral  Resp: 18  19 20   Height:      Weight:  75 kg (165 lb 5.5 oz)  75 kg (165 lb 5.5 oz)  SpO2: 97%  98% 96%    General: Pt is alert, follows commands appropriately, not in acute distress Cardiovascular: Regular rate and rhythm, S1/S2 +, no murmurs, no rubs, no gallops Respiratory: Clear to auscultation bilaterally, no wheezing, no crackles, no rhonchi Abdominal: Soft, non tender, non distended, bowel sounds +, no guarding Extremities: no edema, no cyanosis, pulses palpable bilaterally DP and PT Neuro: Grossly nonfocal  Discharge Instructions  Discharge Orders   Future Orders Complete By Expires   Call MD for:  difficulty breathing, headache or visual disturbances  As directed    Call MD for:  persistant dizziness or light-headedness  As directed  Call MD for:  persistant nausea and vomiting  As directed    Call MD for:  severe uncontrolled pain  As directed    Diet - low sodium heart healthy  As directed    Discharge instructions  As directed    Comments:     Please follow up in our clinic per scheduled appt; see in follow up section. Once you establish PCP we can provide psychiatry referral for you  Who can then follow up with you and make recommendations for medications if any needed Pacemaker functioning well, may follow up with cardio per recommendations from cardio.   Increase activity  slowly  As directed        Medication List    Notice   You have not been prescribed any medications.         Follow-up Information   Follow up with [facility]     On 11/04/2013.   Contact information:   [address] [place] [address] [phone]       The results of significant diagnostics from this hospitalization (including imaging, microbiology, ancillary and laboratory) are listed below for reference.    Significant Diagnostic Studies: Dg Hip Complete Left  10/01/2013   CLINICAL DATA:  Multiple syncopal episodes.  Pain in the left thigh.  EXAM: LEFT HIP - COMPLETE 2+ VIEW  COMPARISON:  None.  FINDINGS: Pelvic bony ring is intact. Calcifications in the pelvis are suggestive for phleboliths. Nonspecific bowel gas pattern. Left hip is located without a fracture. Sclerosis in the left superior acetabulum is consistent with mild degenerative changes.  IMPRESSION: Mild degenerative changes in the left hip. No acute bone abnormality.   Electronically Signed   By: Richarda Overlie M.D.   On: 10/01/2013 15:12   Ct Head Wo Contrast  10/01/2013   CLINICAL DATA:  Syncope/trauma/ headache.  Fall.  EXAM: CT HEAD WITHOUT CONTRAST  TECHNIQUE: Contiguous axial images were obtained from the base of the skull through the vertex without intravenous contrast.  COMPARISON:  10/19/2012  FINDINGS: Ventricles, cisterns and other CSF spaces are within normal. There is no mass, mass effect, shift of midline structures or acute hemorrhage. There is no evidence to suggest acute infarction. Remaining bones and soft tissues are within normal.  IMPRESSION: No acute intracranial findings.   Electronically Signed   By: Elberta Fortis M.D.   On: 10/01/2013 15:24   Ct Cervical Spine Wo Contrast  10/01/2013   CLINICAL DATA:  Head trauma secondary to syncope and a fall.  Pain.  EXAM: CT CERVICAL SPINE WITHOUT CONTRAST  TECHNIQUE: Multidetector CT imaging of the cervical spine was  performed without intravenous contrast. Multiplanar CT image reconstructions were also generated.  COMPARISON:  CT scan dated 10/19/2012  FINDINGS: There is no acute fracture or subluxation or prevertebral soft tissue swelling. There is multilevel degenerative disc disease with small broad-based disc protrusions at C3-4 through C6-7 with narrowing of the AP dimension of the spinal canal at multiple levels to a minimum AP dimension of 6 mm at C4-5 and C5-6. This is unchanged. No facet arthritis.  IMPRESSION: No acute abnormalities of the cervical spine. Multilevel degenerative disc disease.   Electronically Signed   By: Geanie Cooley M.D.   On: 10/01/2013 15:49    Microbiology: No results found for this or any previous visit (from the past 240 hour(s)).   Labs: Basic Metabolic Panel:  Recent Labs Lab 10/01/13 1356 10/02/13 0631  NA 139 140  K 4.5 4.0  CL 99 102  CO2 28 27  GLUCOSE 87 82  BUN 15 13  CREATININE 0.92 0.96  CALCIUM 9.5 8.9   Liver Function Tests:  Recent Labs Lab 10/02/13 0631  AST 22  ALT 11  ALKPHOS 102  BILITOT 0.7  PROT 7.0  ALBUMIN 3.7   No results found for this basename: LIPASE, AMYLASE,  in the last 168 hours No results found for this basename: AMMONIA,  in the last 168 hours CBC:  Recent Labs Lab 10/01/13 1356 10/02/13 0631  WBC 7.2 6.9  NEUTROABS 4.1  --   HGB 15.2 14.9  HCT 45.0 43.4  MCV 98.7 98.6  PLT 186 174   Cardiac Enzymes:  Recent Labs Lab 10/01/13 1356 10/01/13 1924 10/02/13 0002 10/02/13 0632  TROPONINI <0.30 <0.30 <0.30 <0.30   BNP: BNP (last 3 results)  Recent Labs  10/19/12 1447  PROBNP 62.7   CBG:  Recent Labs Lab 10/02/13 0752 10/03/13 0855  GLUCAP 159* 120*    Time coordinating discharge: Over 30 minutes

## 2013-10-03 NOTE — Discharge Instructions (Signed)
Syncope  Syncope is a fainting spell. This means the person loses consciousness and drops to the ground. The person is generally unconscious for less than 5 minutes. The person may have some muscle twitches for up to 15 seconds before waking up and returning to normal. Syncope occurs more often in elderly people, but it can happen to anyone. While most causes of syncope are not dangerous, syncope can be a sign of a serious medical problem. It is important to seek medical care.   CAUSES   Syncope is caused by a sudden decrease in blood flow to the brain. The specific cause is often not determined. Factors that can trigger syncope include:   Taking medicines that lower blood pressure.   Sudden changes in posture, such as standing up suddenly.   Taking more medicine than prescribed.   Standing in one place for too long.   Seizure disorders.   Dehydration and excessive exposure to heat.   Low blood sugar (hypoglycemia).   Straining to have a bowel movement.   Heart disease, irregular heartbeat, or other circulatory problems.   Fear, emotional distress, seeing blood, or severe pain.  SYMPTOMS   Right before fainting, you may:   Feel dizzy or lightheaded.   Feel nauseous.   See all white or all black in your field of vision.   Have cold, clammy skin.  DIAGNOSIS   Your caregiver will ask about your symptoms, perform a physical exam, and perform electrocardiography (ECG) to record the electrical activity of your heart. Your caregiver may also perform other heart or blood tests to determine the cause of your syncope.  TREATMENT   In most cases, no treatment is needed. Depending on the cause of your syncope, your caregiver may recommend changing or stopping some of your medicines.  HOME CARE INSTRUCTIONS   Have someone stay with you until you feel stable.   Do not drive, operate machinery, or play sports until your caregiver says it is okay.   Keep all follow-up appointments as directed by your  caregiver.   Lie down right away if you start feeling like you might faint. Breathe deeply and steadily. Wait until all the symptoms have passed.   Drink enough fluids to keep your urine clear or pale yellow.   If you are taking blood pressure or heart medicine, get up slowly, taking several minutes to sit and then stand. This can reduce dizziness.  SEEK IMMEDIATE MEDICAL CARE IF:    You have a severe headache.   You have unusual pain in the chest, abdomen, or back.   You are bleeding from the mouth or rectum, or you have black or tarry stool.   You have an irregular or very fast heartbeat.   You have pain with breathing.   You have repeated fainting or seizure-like jerking during an episode.   You faint when sitting or lying down.   You have confusion.   You have difficulty walking.   You have severe weakness.   You have vision problems.  If you fainted, call your local emergency services (911 in [place] Do not drive yourself to the hospital.   MAKE SURE YOU:   Understand these instructions.   Will watch your condition.   Will get help right away if you are not doing well or get worse.  Document Released: 08/21/2005 Document Revised: 02/20/2012 Document Reviewed: 10/20/2011  ExitCare Patient Information 2014 [employer].

## 2013-10-03 NOTE — Care Management Note (Signed)
    Page 1 of 1   10/03/2013     1:17:49 PM   CARE MANAGEMENT NOTE 10/03/2013  Patient:  Harry Zhang,Harry Zhang   Account Number:  [id number]  Date Initiated:  10/03/2013  Documentation initiated by:  Rosemary HolmsOBSON,Ladonne Sharples  Subjective/Objective Assessment:   Pt lives with mother and brother. No PCP. Dr. Elisabeth Pigeonevine [facility] [place]. 1st available appt will be 3/3. Pt advised to call clinic to determine the time. pt also set up with the [facility]     Action/Plan:   [facility] for next Friday.   Anticipated DC Date:  10/03/2013   Anticipated DC Plan:  HOME/SELF CARE      DC Planning Services  CM consult      Choice offered to / List presented to:             Status of service:  Completed, signed off Medicare Important Message given?   (If response is "NO", the following Medicare IM given date fields will be blank) Date Medicare IM given:   Date Additional Medicare IM given:    Discharge Disposition:    Per UR Regulation:    If discussed at Long Length of Stay Meetings, dates discussed:    Comments:  10/03/13 Rosemary HolmsAmy Jordanny Waddington RN BSN CM

## 2013-10-03 NOTE — Clinical Social Work Note (Signed)
CSW made hospital discharge appointment w [facility] for patient - Monday Feb 2 (7:45 - 11 AM).  [facility] # given for transport if needed.  CSW gave info to patient.  Santa GeneraAnne Cunningham, LCSW Clinical Social Worker [phone])

## 2013-10-23 ENCOUNTER — Inpatient Hospital Stay: Payer: 59 | Admitting: Internal Medicine

## 2014-01-03 ENCOUNTER — Encounter: Payer: Self-pay | Admitting: *Deleted

## 2014-01-06 ENCOUNTER — Emergency Department (HOSPITAL_COMMUNITY)
Admission: EM | Admit: 2014-01-06 | Discharge: 2014-01-06 | Discharge status: Against Medical Advice | Payer: 59 | Attending: Emergency Medicine | Admitting: Emergency Medicine

## 2014-01-06 ENCOUNTER — Encounter (HOSPITAL_COMMUNITY): Payer: Self-pay | Admitting: Emergency Medicine

## 2014-01-06 DIAGNOSIS — M549 Dorsalgia, unspecified: Secondary | ICD-10-CM | POA: Insufficient documentation

## 2014-01-06 DIAGNOSIS — R0602 Shortness of breath: Secondary | ICD-10-CM | POA: Insufficient documentation

## 2014-01-06 DIAGNOSIS — G8911 Acute pain due to trauma: Secondary | ICD-10-CM | POA: Insufficient documentation

## 2014-01-06 DIAGNOSIS — I2510 Atherosclerotic heart disease of native coronary artery without angina pectoris: Secondary | ICD-10-CM | POA: Insufficient documentation

## 2014-01-06 DIAGNOSIS — R209 Unspecified disturbances of skin sensation: Secondary | ICD-10-CM | POA: Insufficient documentation

## 2014-01-06 DIAGNOSIS — R110 Nausea: Secondary | ICD-10-CM | POA: Insufficient documentation

## 2014-01-06 HISTORY — DX: Reserved for concepts with insufficient information to code with codable children: IMO0002

## 2014-01-06 NOTE — ED Notes (Signed)
No answer in waiting room or restroom

## 2014-01-06 NOTE — ED Notes (Signed)
No answer in waiting room 

## 2014-01-06 NOTE — ED Notes (Signed)
No answer from waiting room or restroom. 3rd time.

## 2014-01-06 NOTE — ED Notes (Addendum)
Pt reports upper back pain since injury in August. Pt reports was seen for same in January. Pt reports pain has increased since friday and reports pain is worse with a deep breath and movement. Pt reports intermittent SOB, nausea, numbness/tingling in right arm. Distal pulses strong intact.no dyspnea noted in triage. Pt denies any numbness at this time.

## 2014-03-11 ENCOUNTER — Encounter: Payer: Self-pay | Admitting: *Deleted

## 2014-05-21 ENCOUNTER — Encounter (HOSPITAL_COMMUNITY): Payer: Self-pay | Admitting: Emergency Medicine

## 2014-05-21 ENCOUNTER — Emergency Department (HOSPITAL_COMMUNITY)
Admission: EM | Admit: 2014-05-21 | Discharge: 2014-05-21 | Disposition: A | Discharge status: Home / Self Care | Payer: 59 | Attending: Emergency Medicine | Admitting: Emergency Medicine

## 2014-05-21 DIAGNOSIS — I2510 Atherosclerotic heart disease of native coronary artery without angina pectoris: Secondary | ICD-10-CM | POA: Insufficient documentation

## 2014-05-21 DIAGNOSIS — Z950 Presence of cardiac pacemaker: Secondary | ICD-10-CM | POA: Insufficient documentation

## 2014-05-21 DIAGNOSIS — Z8659 Personal history of other mental and behavioral disorders: Secondary | ICD-10-CM | POA: Insufficient documentation

## 2014-05-21 DIAGNOSIS — R197 Diarrhea, unspecified: Secondary | ICD-10-CM

## 2014-05-21 DIAGNOSIS — R630 Anorexia: Secondary | ICD-10-CM | POA: Insufficient documentation

## 2014-05-21 DIAGNOSIS — R21 Rash and other nonspecific skin eruption: Secondary | ICD-10-CM | POA: Insufficient documentation

## 2014-05-21 DIAGNOSIS — IMO0001 Reserved for inherently not codable concepts without codable children: Secondary | ICD-10-CM | POA: Insufficient documentation

## 2014-05-21 DIAGNOSIS — R5381 Other malaise: Secondary | ICD-10-CM | POA: Insufficient documentation

## 2014-05-21 DIAGNOSIS — Z8739 Personal history of other diseases of the musculoskeletal system and connective tissue: Secondary | ICD-10-CM | POA: Insufficient documentation

## 2014-05-21 DIAGNOSIS — Z87828 Personal history of other (healed) physical injury and trauma: Secondary | ICD-10-CM | POA: Insufficient documentation

## 2014-05-21 DIAGNOSIS — Z9889 Other specified postprocedural states: Secondary | ICD-10-CM | POA: Insufficient documentation

## 2014-05-21 DIAGNOSIS — Z87891 Personal history of nicotine dependence: Secondary | ICD-10-CM | POA: Insufficient documentation

## 2014-05-21 DIAGNOSIS — Z8679 Personal history of other diseases of the circulatory system: Secondary | ICD-10-CM | POA: Insufficient documentation

## 2014-05-21 DIAGNOSIS — R5383 Other fatigue: Secondary | ICD-10-CM

## 2014-05-21 LAB — CBC WITH DIFFERENTIAL/PLATELET
Basophils Absolute: 0 10*3/uL (ref 0.0–0.1)
Basophils Relative: 0 % (ref 0–1)
Eosinophils Absolute: 0.2 10*3/uL (ref 0.0–0.7)
Eosinophils Relative: 3 % (ref 0–5)
HCT: 39.5 % (ref 39.0–52.0)
HEMOGLOBIN: 13.5 g/dL (ref 13.0–17.0)
LYMPHS ABS: 1.9 10*3/uL (ref 0.7–4.0)
LYMPHS PCT: 27 % (ref 12–46)
MCH: 34.1 pg — AB (ref 26.0–34.0)
MCHC: 34.2 g/dL (ref 30.0–36.0)
MCV: 99.7 fL (ref 78.0–100.0)
MONOS PCT: 8 % (ref 3–12)
Monocytes Absolute: 0.5 10*3/uL (ref 0.1–1.0)
NEUTROS ABS: 4.4 10*3/uL (ref 1.7–7.7)
NEUTROS PCT: 62 % (ref 43–77)
PLATELETS: 171 10*3/uL (ref 150–400)
RBC: 3.96 MIL/uL — AB (ref 4.22–5.81)
RDW: 13.4 % (ref 11.5–15.5)
WBC: 7 10*3/uL (ref 4.0–10.5)

## 2014-05-21 LAB — BASIC METABOLIC PANEL
Anion gap: 12 (ref 5–15)
BUN: 13 mg/dL (ref 6–23)
CO2: 26 meq/L (ref 19–32)
Calcium: 8.8 mg/dL (ref 8.4–10.5)
Chloride: 101 mEq/L (ref 96–112)
Creatinine, Ser: 0.97 mg/dL (ref 0.50–1.35)
GFR calc Af Amer: >90 mL/min (ref >90)
Glucose, Bld: 137 mg/dL — ABNORMAL HIGH (ref 70–99)
POTASSIUM: 3.5 meq/L — AB (ref 3.7–5.3)
SODIUM: 139 meq/L (ref 137–147)

## 2014-05-21 MED ORDER — PREDNISONE 10 MG PO TABS
60.0000 mg | ORAL_TABLET | Freq: Once | ORAL | Status: AC
  Administered 2014-05-21: 60 mg via ORAL
  Filled 2014-05-21 (×2): qty 1

## 2014-05-21 MED ORDER — DIPHENHYDRAMINE HCL 25 MG PO TABS: ORAL_TABLET | ORAL | Status: DC

## 2014-05-21 MED ORDER — DIPHENHYDRAMINE HCL 25 MG PO CAPS
25.0000 mg | ORAL_CAPSULE | Freq: Once | ORAL | Status: AC
  Administered 2014-05-21: 25 mg via ORAL
  Filled 2014-05-21: qty 1

## 2014-05-21 MED ORDER — PREDNISONE 10 MG PO TABS: ORAL_TABLET | ORAL | Status: DC

## 2014-05-21 NOTE — ED Notes (Signed)
Pt co "bumps and rash all over my body", poor appetite, diarrhea x 2 months. Pt ambulates without difficulty.

## 2014-05-21 NOTE — ED Provider Notes (Signed)
Medical screening examination/treatment/procedure(s) were conducted as a shared visit with non-physician practitioner(s) or resident  and myself.  I personally evaluated the patient during the encounter and agree with the findings.   I have personally reviewed any xrays and/ or EKG's with the provider and I agree with interpretation.   Patient nonspecific papular macular rash worse behind both legs and ankles. Patient denies any new or known exposures. No petechia or purpura, patient well-appearing otherwise, no fevers. Trial of allergic treatment and followup outpatient. Rash     Enid Skeens, MD 05/21/14 [phone]

## 2014-05-21 NOTE — Discharge Instructions (Signed)
Rash A rash is a change in the color or feel of your skin. There are many different types of rashes. You may have other problems along with your rash. HOME CARE  Avoid the thing that caused your rash.  Do not scratch your rash.  You may take cools baths to help stop itching.  Only take medicines as told by your doctor.  Keep all doctor visits as told. GET HELP RIGHT AWAY IF:   Your pain, puffiness (swelling), or redness gets worse.  You have a fever.  You have new or severe problems.  You have body aches, watery poop (diarrhea), or you throw up (vomit).  Your rash is not better after 3 days. MAKE SURE YOU:   Understand these instructions.  Will watch your condition.  Will get help right away if you are not doing well or get worse. Document Released: 02/07/2008 Document Revised: 11/13/2011 Document Reviewed: 06/05/2011 Exit[employer] Patient Information 2015 [employer]. This information is not intended to replace advice given to you by your health care provider. Make sure you discuss any questions you have with your health care provider.  
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
## 2014-06-05 ENCOUNTER — Telehealth: Payer: Self-pay | Admitting: Cardiovascular Disease

## 2014-06-05 NOTE — Telephone Encounter (Signed)
06-05-14 PAST 30 DAYS FROM CERT LETTER SENT 03-11-14/MT

## 2014-07-20 ENCOUNTER — Encounter (HOSPITAL_COMMUNITY): Payer: Self-pay | Admitting: *Deleted

## 2014-07-20 ENCOUNTER — Emergency Department (HOSPITAL_COMMUNITY)
Admission: EM | Admit: 2014-07-20 | Discharge: 2014-07-20 | Disposition: A | Discharge status: Home / Self Care | Payer: 59 | Attending: Emergency Medicine | Admitting: Emergency Medicine

## 2014-07-20 DIAGNOSIS — Z8659 Personal history of other mental and behavioral disorders: Secondary | ICD-10-CM | POA: Insufficient documentation

## 2014-07-20 DIAGNOSIS — I2510 Atherosclerotic heart disease of native coronary artery without angina pectoris: Secondary | ICD-10-CM | POA: Insufficient documentation

## 2014-07-20 DIAGNOSIS — M519 Unspecified thoracic, thoracolumbar and lumbosacral intervertebral disc disorder: Secondary | ICD-10-CM | POA: Insufficient documentation

## 2014-07-20 DIAGNOSIS — H5711 Ocular pain, right eye: Secondary | ICD-10-CM

## 2014-07-20 DIAGNOSIS — Z87828 Personal history of other (healed) physical injury and trauma: Secondary | ICD-10-CM | POA: Insufficient documentation

## 2014-07-20 DIAGNOSIS — Z87891 Personal history of nicotine dependence: Secondary | ICD-10-CM | POA: Insufficient documentation

## 2014-07-20 MED ORDER — TETRACAINE HCL 0.5 % OP SOLN
1.0000 [drp] | Freq: Once | OPHTHALMIC | Status: AC
  Administered 2014-07-20: 1 [drp] via OPHTHALMIC
  Filled 2014-07-20: qty 2

## 2014-07-20 MED ORDER — FLUORESCEIN SODIUM 1 MG OP STRP
1.0000 | ORAL_STRIP | Freq: Once | OPHTHALMIC | Status: AC
  Administered 2014-07-20: 1 via OPHTHALMIC
  Filled 2014-07-20: qty 1

## 2014-07-20 NOTE — ED Notes (Signed)
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 Pt also states, "I want to get my heart checked out to make sure that machine didn't do anything to me" pt states heart started racing last night while using machine to clean the floors. States he wants to make sure pacemaker is ok. NAD

## 2014-07-20 NOTE — ED Notes (Signed)
Irrigating pt's rt eye with Morgan lens and NS. Pt tolerating well.

## 2014-07-20 NOTE — ED Notes (Signed)
Pt states eye feels much better after irrigation.

## 2014-07-20 NOTE — ED Notes (Signed)
Pt has [employer] Pacemaker. Pt states he does not want a male nurse. Charge nurse informed.

## 2014-07-20 NOTE — Discharge Instructions (Signed)
I suspect you may have had a foreign body which has now resolved.  Return here or call the eye specialist listed above if you have any further problems with your eye.  Your ekg is good today.  There is no sign of injury to your pacemaker from using that power equipment yesterday.

## 2014-07-20 NOTE — ED Notes (Signed)
Discharge instructions reviewed with pt, questions answered. Pt verbalized understanding.  
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
## 2014-08-25 ENCOUNTER — Emergency Department (HOSPITAL_COMMUNITY): Payer: 59

## 2014-08-25 ENCOUNTER — Encounter (HOSPITAL_COMMUNITY): Payer: Self-pay

## 2014-08-25 ENCOUNTER — Emergency Department (HOSPITAL_COMMUNITY)
Admission: EM | Admit: 2014-08-25 | Discharge: 2014-08-25 | Disposition: A | Discharge status: Home / Self Care | Payer: 59 | Attending: Emergency Medicine | Admitting: Emergency Medicine

## 2014-08-25 DIAGNOSIS — Z87891 Personal history of nicotine dependence: Secondary | ICD-10-CM | POA: Insufficient documentation

## 2014-08-25 DIAGNOSIS — Z7982 Long term (current) use of aspirin: Secondary | ICD-10-CM | POA: Insufficient documentation

## 2014-08-25 DIAGNOSIS — R062 Wheezing: Secondary | ICD-10-CM | POA: Insufficient documentation

## 2014-08-25 DIAGNOSIS — Z950 Presence of cardiac pacemaker: Secondary | ICD-10-CM | POA: Insufficient documentation

## 2014-08-25 DIAGNOSIS — F329 Major depressive disorder, single episode, unspecified: Secondary | ICD-10-CM | POA: Insufficient documentation

## 2014-08-25 DIAGNOSIS — J40 Bronchitis, not specified as acute or chronic: Secondary | ICD-10-CM

## 2014-08-25 DIAGNOSIS — R0789 Other chest pain: Secondary | ICD-10-CM

## 2014-08-25 DIAGNOSIS — I2510 Atherosclerotic heart disease of native coronary artery without angina pectoris: Secondary | ICD-10-CM | POA: Insufficient documentation

## 2014-08-25 DIAGNOSIS — R55 Syncope and collapse: Secondary | ICD-10-CM | POA: Insufficient documentation

## 2014-08-25 DIAGNOSIS — F1410 Cocaine abuse, uncomplicated: Secondary | ICD-10-CM | POA: Insufficient documentation

## 2014-08-25 DIAGNOSIS — Z87828 Personal history of other (healed) physical injury and trauma: Secondary | ICD-10-CM | POA: Insufficient documentation

## 2014-08-25 DIAGNOSIS — Z7952 Long term (current) use of systemic steroids: Secondary | ICD-10-CM | POA: Insufficient documentation

## 2014-08-25 DIAGNOSIS — R0981 Nasal congestion: Secondary | ICD-10-CM | POA: Insufficient documentation

## 2014-08-25 DIAGNOSIS — Z8739 Personal history of other diseases of the musculoskeletal system and connective tissue: Secondary | ICD-10-CM | POA: Insufficient documentation

## 2014-08-25 DIAGNOSIS — R0602 Shortness of breath: Secondary | ICD-10-CM | POA: Insufficient documentation

## 2014-08-25 HISTORY — DX: Presence of cardiac pacemaker: Z95.0

## 2014-08-25 LAB — TROPONIN I
Troponin I: <0.03 ng/mL (ref <0.031)
Troponin I: <0.03 ng/mL (ref <0.031)

## 2014-08-25 LAB — CBC WITH DIFFERENTIAL/PLATELET
Basophils Absolute: 0 10*3/uL (ref 0.0–0.1)
Basophils Relative: 0 % (ref 0–1)
Eosinophils Absolute: 0.1 10*3/uL (ref 0.0–0.7)
Eosinophils Relative: 0 % (ref 0–5)
HCT: 39.6 % (ref 39.0–52.0)
HEMOGLOBIN: 13.2 g/dL (ref 13.0–17.0)
LYMPHS ABS: 1.7 10*3/uL (ref 0.7–4.0)
LYMPHS PCT: 10 % — AB (ref 12–46)
MCH: 33.4 pg (ref 26.0–34.0)
MCHC: 33.3 g/dL (ref 30.0–36.0)
MCV: 100.3 fL — ABNORMAL HIGH (ref 78.0–100.0)
Monocytes Absolute: 0.9 10*3/uL (ref 0.1–1.0)
Monocytes Relative: 5 % (ref 3–12)
NEUTROS PCT: 85 % — AB (ref 43–77)
Neutro Abs: 14.4 10*3/uL — ABNORMAL HIGH (ref 1.7–7.7)
PLATELETS: 154 10*3/uL (ref 150–400)
RBC: 3.95 MIL/uL — AB (ref 4.22–5.81)
RDW: 13.7 % (ref 11.5–15.5)
WBC: 17.1 10*3/uL — ABNORMAL HIGH (ref 4.0–10.5)

## 2014-08-25 LAB — URINALYSIS, ROUTINE W REFLEX MICROSCOPIC
Bilirubin Urine: NEGATIVE
GLUCOSE, UA: NEGATIVE mg/dL
Hgb urine dipstick: NEGATIVE
KETONES UR: NEGATIVE mg/dL
Leukocytes, UA: NEGATIVE
Nitrite: NEGATIVE
PROTEIN: NEGATIVE mg/dL
Specific Gravity, Urine: <1.005 — ABNORMAL LOW (ref 1.005–1.030)
Urobilinogen, UA: 0.2 mg/dL (ref 0.0–1.0)
pH: 6 (ref 5.0–8.0)

## 2014-08-25 LAB — RAPID URINE DRUG SCREEN, HOSP PERFORMED
AMPHETAMINES: NOT DETECTED
BENZODIAZEPINES: NOT DETECTED
Barbiturates: NOT DETECTED
Cocaine: NOT DETECTED
OPIATES: NOT DETECTED
Tetrahydrocannabinol: POSITIVE — AB

## 2014-08-25 LAB — COMPREHENSIVE METABOLIC PANEL
ALT: 22 U/L (ref 0–53)
AST: 77 U/L — ABNORMAL HIGH (ref 0–37)
Albumin: 4 g/dL (ref 3.5–5.2)
Alkaline Phosphatase: 104 U/L (ref 39–117)
Anion gap: 7 (ref 5–15)
BUN: 12 mg/dL (ref 6–23)
CO2: 25 mmol/L (ref 19–32)
CREATININE: 0.81 mg/dL (ref 0.50–1.35)
Calcium: 8.9 mg/dL (ref 8.4–10.5)
Chloride: 103 mEq/L (ref 96–112)
GLUCOSE: 134 mg/dL — AB (ref 70–99)
Potassium: 3.7 mmol/L (ref 3.5–5.1)
Sodium: 135 mmol/L (ref 135–145)
TOTAL PROTEIN: 6.9 g/dL (ref 6.0–8.3)
Total Bilirubin: 0.5 mg/dL (ref 0.3–1.2)

## 2014-08-25 LAB — ETHANOL

## 2014-08-25 MED ORDER — CYCLOBENZAPRINE HCL 10 MG PO TABS: 10.0000 mg | ORAL_TABLET | Freq: Three times a day (TID) | ORAL | Status: DC | PRN

## 2014-08-25 MED ORDER — KETOROLAC TROMETHAMINE 30 MG/ML IJ SOLN
30.0000 mg | Freq: Once | INTRAMUSCULAR | Status: AC
  Administered 2014-08-25: 30 mg via INTRAVENOUS
  Filled 2014-08-25: qty 1

## 2014-08-25 MED ORDER — DM-GUAIFENESIN ER 30-600 MG PO TB12: 1.0000 | ORAL_TABLET | Freq: Two times a day (BID) | ORAL | Status: DC | PRN

## 2014-08-25 MED ORDER — AMOXICILLIN 500 MG PO CAPS: 500.0000 mg | ORAL_CAPSULE | Freq: Three times a day (TID) | ORAL | Status: DC

## 2014-08-25 MED ORDER — NAPROXEN 500 MG PO TABS: ORAL_TABLET | ORAL | Status: DC

## 2014-08-25 MED ORDER — CYCLOBENZAPRINE HCL 10 MG PO TABS
10.0000 mg | ORAL_TABLET | Freq: Once | ORAL | Status: AC
  Administered 2014-08-25: 10 mg via ORAL
  Filled 2014-08-25: qty 1

## 2014-08-25 NOTE — Discharge Instructions (Signed)
Use ice packs and heat to your back. Take the medications as prescribed. Follow up with the cardiologist about your pacemaker. Recheck if you get worse.

## 2014-08-25 NOTE — ED Notes (Signed)
Pt reports left and right chest, states pain is sometimes worse with movement. Pt states he is supposed to be on several meds for his heart but has not been taking them for a couple of years, also has a pacemaker.
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
## 2015-03-05 ENCOUNTER — Encounter (HOSPITAL_COMMUNITY): Payer: Self-pay | Admitting: Emergency Medicine

## 2015-03-05 ENCOUNTER — Emergency Department (HOSPITAL_COMMUNITY)
Admission: EM | Admit: 2015-03-05 | Discharge: 2015-03-05 | Disposition: A | Discharge status: Home / Self Care | Payer: 59 | Attending: Emergency Medicine | Admitting: Emergency Medicine

## 2015-03-05 DIAGNOSIS — R109 Unspecified abdominal pain: Secondary | ICD-10-CM | POA: Insufficient documentation

## 2015-03-05 DIAGNOSIS — R112 Nausea with vomiting, unspecified: Secondary | ICD-10-CM | POA: Insufficient documentation

## 2015-03-05 DIAGNOSIS — I2510 Atherosclerotic heart disease of native coronary artery without angina pectoris: Secondary | ICD-10-CM | POA: Insufficient documentation

## 2015-03-05 DIAGNOSIS — Z87828 Personal history of other (healed) physical injury and trauma: Secondary | ICD-10-CM | POA: Insufficient documentation

## 2015-03-05 DIAGNOSIS — Z8739 Personal history of other diseases of the musculoskeletal system and connective tissue: Secondary | ICD-10-CM | POA: Insufficient documentation

## 2015-03-05 DIAGNOSIS — Z950 Presence of cardiac pacemaker: Secondary | ICD-10-CM | POA: Insufficient documentation

## 2015-03-05 DIAGNOSIS — Z8659 Personal history of other mental and behavioral disorders: Secondary | ICD-10-CM | POA: Insufficient documentation

## 2015-03-05 DIAGNOSIS — R197 Diarrhea, unspecified: Secondary | ICD-10-CM | POA: Insufficient documentation

## 2015-03-05 DIAGNOSIS — Z87891 Personal history of nicotine dependence: Secondary | ICD-10-CM | POA: Insufficient documentation

## 2015-03-05 LAB — URINALYSIS, ROUTINE W REFLEX MICROSCOPIC
Bilirubin Urine: NEGATIVE
Glucose, UA: NEGATIVE mg/dL
Hgb urine dipstick: NEGATIVE
Ketones, ur: NEGATIVE mg/dL
Nitrite: NEGATIVE
Protein, ur: NEGATIVE mg/dL
SPECIFIC GRAVITY, URINE: 1.01 (ref 1.005–1.030)
Urobilinogen, UA: 0.2 mg/dL (ref 0.0–1.0)
pH: 6.5 (ref 5.0–8.0)

## 2015-03-05 LAB — CBC WITH DIFFERENTIAL/PLATELET
BASOS PCT: 0 % (ref 0–1)
Basophils Absolute: 0 10*3/uL (ref 0.0–0.1)
EOS ABS: 0.2 10*3/uL (ref 0.0–0.7)
Eosinophils Relative: 2 % (ref 0–5)
HEMATOCRIT: 40.3 % (ref 39.0–52.0)
HEMOGLOBIN: 13.1 g/dL (ref 13.0–17.0)
Lymphocytes Relative: 29 % (ref 12–46)
Lymphs Abs: 1.9 10*3/uL (ref 0.7–4.0)
MCH: 32.8 pg (ref 26.0–34.0)
MCHC: 32.5 g/dL (ref 30.0–36.0)
MCV: 100.8 fL — ABNORMAL HIGH (ref 78.0–100.0)
Monocytes Absolute: 0.5 10*3/uL (ref 0.1–1.0)
Monocytes Relative: 7 % (ref 3–12)
NEUTROS PCT: 62 % (ref 43–77)
Neutro Abs: 3.8 10*3/uL (ref 1.7–7.7)
Platelets: 155 10*3/uL (ref 150–400)
RBC: 4 MIL/uL — ABNORMAL LOW (ref 4.22–5.81)
RDW: 13.7 % (ref 11.5–15.5)
WBC: 6.3 10*3/uL (ref 4.0–10.5)

## 2015-03-05 LAB — URINE MICROSCOPIC-ADD ON

## 2015-03-05 LAB — COMPREHENSIVE METABOLIC PANEL
ALBUMIN: 3.7 g/dL (ref 3.5–5.0)
ALT: 13 U/L — AB (ref 17–63)
ANION GAP: 5 (ref 5–15)
AST: 22 U/L (ref 15–41)
Alkaline Phosphatase: 102 U/L (ref 38–126)
BILIRUBIN TOTAL: 0.5 mg/dL (ref 0.3–1.2)
BUN: 9 mg/dL (ref 6–20)
CO2: 30 mmol/L (ref 22–32)
CREATININE: 0.88 mg/dL (ref 0.61–1.24)
Calcium: 8.4 mg/dL — ABNORMAL LOW (ref 8.9–10.3)
Chloride: 102 mmol/L (ref 101–111)
GFR calc Af Amer: >60 mL/min (ref >60)
GFR calc non Af Amer: >60 mL/min (ref >60)
GLUCOSE: 85 mg/dL (ref 65–99)
POTASSIUM: 3.8 mmol/L (ref 3.5–5.1)
SODIUM: 137 mmol/L (ref 135–145)
TOTAL PROTEIN: 6.3 g/dL — AB (ref 6.5–8.1)

## 2015-03-05 LAB — LIPASE, BLOOD: LIPASE: 14 U/L — AB (ref 22–51)

## 2015-03-05 MED ORDER — SODIUM CHLORIDE 0.9 % IV SOLN: 1000.0000 mL | INTRAVENOUS | Status: DC

## 2015-03-05 MED ORDER — ONDANSETRON HCL 4 MG/2ML IJ SOLN
4.0000 mg | Freq: Once | INTRAMUSCULAR | Status: AC
  Administered 2015-03-05: 4 mg via INTRAVENOUS
  Filled 2015-03-05: qty 2

## 2015-03-05 MED ORDER — SODIUM CHLORIDE 0.9 % IV SOLN
1000.0000 mL | Freq: Once | INTRAVENOUS | Status: AC
  Administered 2015-03-05: 1000 mL via INTRAVENOUS

## 2015-03-05 MED ORDER — DICYCLOMINE HCL 20 MG PO TABS: 20.0000 mg | ORAL_TABLET | Freq: Two times a day (BID) | ORAL | Status: DC

## 2015-03-05 NOTE — ED Notes (Signed)
Patient complaining of diarrhea and abdominal pain "for several months." States "I need to be seen for it and figure out what's going on."
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
## 2015-03-05 NOTE — Discharge Instructions (Signed)

## 2015-05-18 ENCOUNTER — Encounter (HOSPITAL_COMMUNITY): Payer: Self-pay | Admitting: *Deleted

## 2015-05-18 ENCOUNTER — Emergency Department (HOSPITAL_COMMUNITY): Payer: Self-pay

## 2015-05-18 ENCOUNTER — Emergency Department (HOSPITAL_COMMUNITY)
Admission: EM | Admit: 2015-05-18 | Discharge: 2015-05-18 | Disposition: A | Discharge status: Home / Self Care | Payer: Self-pay | Attending: Emergency Medicine | Admitting: Emergency Medicine

## 2015-05-18 DIAGNOSIS — Z87891 Personal history of nicotine dependence: Secondary | ICD-10-CM | POA: Insufficient documentation

## 2015-05-18 DIAGNOSIS — Z8659 Personal history of other mental and behavioral disorders: Secondary | ICD-10-CM | POA: Insufficient documentation

## 2015-05-18 DIAGNOSIS — Z950 Presence of cardiac pacemaker: Secondary | ICD-10-CM | POA: Insufficient documentation

## 2015-05-18 DIAGNOSIS — R0789 Other chest pain: Secondary | ICD-10-CM | POA: Insufficient documentation

## 2015-05-18 DIAGNOSIS — R21 Rash and other nonspecific skin eruption: Secondary | ICD-10-CM | POA: Insufficient documentation

## 2015-05-18 DIAGNOSIS — I2510 Atherosclerotic heart disease of native coronary artery without angina pectoris: Secondary | ICD-10-CM | POA: Insufficient documentation

## 2015-05-18 LAB — CBC
HCT: 40 % (ref 39.0–52.0)
HEMOGLOBIN: 13.4 g/dL (ref 13.0–17.0)
MCH: 34.1 pg — ABNORMAL HIGH (ref 26.0–34.0)
MCHC: 33.5 g/dL (ref 30.0–36.0)
MCV: 101.8 fL — ABNORMAL HIGH (ref 78.0–100.0)
PLATELETS: 160 10*3/uL (ref 150–400)
RBC: 3.93 MIL/uL — AB (ref 4.22–5.81)
RDW: 13.7 % (ref 11.5–15.5)
WBC: 8.2 10*3/uL (ref 4.0–10.5)

## 2015-05-18 LAB — BASIC METABOLIC PANEL
Anion gap: 5 (ref 5–15)
BUN: 16 mg/dL (ref 6–20)
CALCIUM: 8.7 mg/dL — AB (ref 8.9–10.3)
CO2: 32 mmol/L (ref 22–32)
CREATININE: 1 mg/dL (ref 0.61–1.24)
Chloride: 102 mmol/L (ref 101–111)
Glucose, Bld: 79 mg/dL (ref 65–99)
Potassium: 4.2 mmol/L (ref 3.5–5.1)
Sodium: 139 mmol/L (ref 135–145)

## 2015-05-18 MED ORDER — HYDROCORTISONE 1 % EX CREA: TOPICAL_CREAM | CUTANEOUS | Status: DC

## 2015-05-18 MED ORDER — NAPROXEN 500 MG PO TABS: ORAL_TABLET | ORAL | Status: DC

## 2015-05-18 MED ORDER — NAPROXEN 250 MG PO TABS
500.0000 mg | ORAL_TABLET | Freq: Once | ORAL | Status: AC
  Administered 2015-05-18: 500 mg via ORAL
  Filled 2015-05-18: qty 2

## 2015-05-18 NOTE — ED Notes (Signed)
MD at bedside. 

## 2015-05-18 NOTE — Discharge Instructions (Signed)

## 2015-05-18 NOTE — ED Notes (Signed)
Pt comes in with chest pain that started yesterday after carrying furniture.  Pain is primarily in left chest. Pt has pacemaker and states the  Pain is coming from that area.   Also, pt comes in with rash noted to his lower extremities. Pt states they itch.   NAD noted. EKG taken to [facility]

## 2015-05-18 NOTE — ED Provider Notes (Signed)
EKG Interpretation  Date/Time:  Tuesday May 18 2015 09:36:11 EDT Ventricular Rate:  60 PR Interval:  145 QRS Duration: 94 QT Interval:  413 QTC Calculation: 413 R Axis:   85 Text Interpretation:  Sinus rhythm RSR' in V1 or V2, right VCD or RVH ST elevation diffusely No significant change was found Confirmed by Abra Lingenfelter  MD, Novalyn Lajara ([id number]) on 05/18/2015 11:05:15 AM      Rash looks more consistent with bug bites. No signs of infection. Chest pain is musculoskeltal  Azalia Bilis, MD 05/18/15 1116
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
## 2015-10-18 ENCOUNTER — Encounter (HOSPITAL_COMMUNITY): Payer: Self-pay | Admitting: *Deleted

## 2015-10-18 ENCOUNTER — Emergency Department (HOSPITAL_COMMUNITY)
Admission: EM | Admit: 2015-10-18 | Discharge: 2015-10-18 | Disposition: A | Discharge status: Home / Self Care | Payer: Self-pay | Attending: Emergency Medicine | Admitting: Emergency Medicine

## 2015-10-18 ENCOUNTER — Emergency Department (HOSPITAL_COMMUNITY): Payer: Self-pay

## 2015-10-18 DIAGNOSIS — F32A Depression, unspecified: Secondary | ICD-10-CM

## 2015-10-18 DIAGNOSIS — Z87891 Personal history of nicotine dependence: Secondary | ICD-10-CM | POA: Insufficient documentation

## 2015-10-18 DIAGNOSIS — Z950 Presence of cardiac pacemaker: Secondary | ICD-10-CM | POA: Insufficient documentation

## 2015-10-18 DIAGNOSIS — I2510 Atherosclerotic heart disease of native coronary artery without angina pectoris: Secondary | ICD-10-CM | POA: Insufficient documentation

## 2015-10-18 DIAGNOSIS — F329 Major depressive disorder, single episode, unspecified: Secondary | ICD-10-CM | POA: Insufficient documentation

## 2015-10-18 DIAGNOSIS — J209 Acute bronchitis, unspecified: Secondary | ICD-10-CM

## 2015-10-18 DIAGNOSIS — Z7952 Long term (current) use of systemic steroids: Secondary | ICD-10-CM | POA: Insufficient documentation

## 2015-10-18 DIAGNOSIS — Z87828 Personal history of other (healed) physical injury and trauma: Secondary | ICD-10-CM | POA: Insufficient documentation

## 2015-10-18 DIAGNOSIS — R0789 Other chest pain: Secondary | ICD-10-CM

## 2015-10-18 LAB — CBC
HCT: 41.4 % (ref 39.0–52.0)
HEMOGLOBIN: 13.6 g/dL (ref 13.0–17.0)
MCH: 33.7 pg (ref 26.0–34.0)
MCHC: 32.9 g/dL (ref 30.0–36.0)
MCV: 102.5 fL — ABNORMAL HIGH (ref 78.0–100.0)
Platelets: 197 10*3/uL (ref 150–400)
RBC: 4.04 MIL/uL — AB (ref 4.22–5.81)
RDW: 13.6 % (ref 11.5–15.5)
WBC: 5.8 10*3/uL (ref 4.0–10.5)

## 2015-10-18 LAB — BASIC METABOLIC PANEL
ANION GAP: 6 (ref 5–15)
BUN: 9 mg/dL (ref 6–20)
CO2: 29 mmol/L (ref 22–32)
Calcium: 9.1 mg/dL (ref 8.9–10.3)
Chloride: 105 mmol/L (ref 101–111)
Creatinine, Ser: 0.95 mg/dL (ref 0.61–1.24)
Glucose, Bld: 65 mg/dL (ref 65–99)
Potassium: 3.7 mmol/L (ref 3.5–5.1)
SODIUM: 140 mmol/L (ref 135–145)

## 2015-10-18 LAB — TROPONIN I

## 2015-10-18 MED ORDER — DOXYCYCLINE HYCLATE 100 MG PO TABS
100.0000 mg | ORAL_TABLET | Freq: Once | ORAL | Status: AC
  Administered 2015-10-18: 100 mg via ORAL
  Filled 2015-10-18: qty 1

## 2015-10-18 MED ORDER — ALBUTEROL SULFATE HFA 108 (90 BASE) MCG/ACT IN AERS
2.0000 | INHALATION_SPRAY | RESPIRATORY_TRACT | Status: DC | PRN
  Administered 2015-10-18: 2 via RESPIRATORY_TRACT
  Filled 2015-10-18 (×2): qty 6.7

## 2015-10-18 MED ORDER — DOXYCYCLINE HYCLATE 100 MG PO CAPS: 100.0000 mg | ORAL_CAPSULE | Freq: Two times a day (BID) | ORAL | Status: DC

## 2015-10-18 NOTE — Discharge Instructions (Signed)
Use the inhaler 2 puffs every 4 hours as needed for cough or trouble breathing. Consider seeing a therapist, at [facility] to help you with your depression. Use the resource guide, to help you find a primary care doctor to see for further help with your cough and trouble breathing.    Acute Bronchitis Bronchitis is inflammation of the airways that extend from the windpipe into the lungs (bronchi). The inflammation often causes mucus to develop. This leads to a cough, which is the most common symptom of bronchitis.  In acute bronchitis, the condition usually develops suddenly and goes away over time, usually in a couple weeks. Smoking, allergies, and asthma can make bronchitis worse. Repeated episodes of bronchitis may cause further lung problems.  CAUSES Acute bronchitis is most often caused by the same virus that causes a cold. The virus can spread from person to person (contagious) through coughing, sneezing, and touching contaminated objects. SIGNS AND SYMPTOMS   Cough.   Fever.   Coughing up mucus.   Body aches.   Chest congestion.   Chills.   Shortness of breath.   Sore throat.  DIAGNOSIS  Acute bronchitis is usually diagnosed through a physical exam. Your health care provider will also ask you questions about your medical history. Tests, such as chest X-rays, are sometimes done to rule out other conditions.  TREATMENT  Acute bronchitis usually goes away in a couple weeks. Oftentimes, no medical treatment is necessary. Medicines are sometimes given for relief of fever or cough. Antibiotic medicines are usually not needed but may be prescribed in certain situations. In some cases, an inhaler may be recommended to help reduce shortness of breath and control the cough. A cool mist vaporizer may also be used to help thin bronchial secretions and make it easier to clear the chest.  HOME CARE INSTRUCTIONS  Get plenty of rest.   Drink enough fluids to keep your urine clear  or pale yellow (unless you have a medical condition that requires fluid restriction). Increasing fluids may help thin your respiratory secretions (sputum) and reduce chest congestion, and it will prevent dehydration.   Take medicines only as directed by your health care provider.  If you were prescribed an antibiotic medicine, finish it all even if you start to feel better.  Avoid smoking and secondhand smoke. Exposure to cigarette smoke or irritating chemicals will make bronchitis worse. If you are a smoker, consider using nicotine gum or skin patches to help control withdrawal symptoms. Quitting smoking will help your lungs heal faster.   Reduce the chances of another bout of acute bronchitis by washing your hands frequently, avoiding people with cold symptoms, and trying not to touch your hands to your mouth, nose, or eyes.   Keep all follow-up visits as directed by your health care provider.  SEEK MEDICAL CARE IF: Your symptoms do not improve after 1 week of treatment.  SEEK IMMEDIATE MEDICAL CARE IF:  You develop an increased fever or chills.   You have chest pain.   You have severe shortness of breath.  You have bloody sputum.   You develop dehydration.  You faint or repeatedly feel like you are going to pass out.  You develop repeated vomiting.  You develop a severe headache. MAKE SURE YOU:   Understand these instructions.  Will watch your condition.  Will get help right away if you are not doing well or get worse.   This information is not intended to replace advice given to you by your  health care provider. Make sure you discuss any questions you have with your health care provider.   Document Released: 09/28/2004 Document Revised: 09/11/2014 Document Reviewed: 02/11/2013 Elsevier Interactive Patient Education 2016 [employer].    Emergency Department Resource Guide 1) Find a Doctor and Pay Out of Pocket Although you won't have to find out who is  covered by your insurance plan, it is a good idea to ask around and get recommendations. You will then need to call the office and see if the doctor you have chosen will accept you as a new patient and what types of options they offer for patients who are self-pay. Some doctors offer discounts or will set up payment plans for their patients who do not have insurance, but you will need to ask so you aren't surprised when you get to your appointment.  2) Contact Your Local Health Department Not all health departments have doctors that can see patients for sick visits, but many do, so it is worth a call to see if yours does. If you don't know where your local health department is, you can check in your phone book. The CDC also has a tool to help you locate your state's health department, and many state websites also have listings of all of their local health departments.  3) Find a Walk-in Clinic If your illness is not likely to be very severe or complicated, you may want to try a walk in clinic. These are popping up all over the country in pharmacies, drugstores, and shopping centers. They're usually staffed by nurse practitioners or physician assistants that have been trained to treat common illnesses and complaints. They're usually fairly quick and inexpensive. However, if you have serious medical issues or chronic medical problems, these are probably not your best option.  No Primary Care Doctor: - Call Health Connect at  [phone] - they can help you locate a primary care doctor that  accepts your insurance, provides certain services, etc. - Physician Referral Service- [phone]  Chronic Pain Problems: Organization         Address  Phone   Notes  [facility]  [phone] Patients need to be referred by their primary care doctor.   Medication Assistance: Organization         Address  Phone   Notes  [facility] [address] [place] [place], [place] [address] [phone] --Must be a resident of [facility] -- Must have NO insurance coverage whatsoever (no Medicaid/ Medicare, etc.) -- The pt. MUST have a primary care doctor that directs their care regularly and follows them in the community   [facility]ist  [phone]   [employer]  [phone]    Agencies that provide inexpensive medical care: Organization         Address  Phone   Notes  [facility]  [phone]   [facility]    [phone]   [facility] [address] [place], [place] [address] [phone]   [facility] [address] [place] [place] [phone]   Planned Parenthood    [phone]   [facility]    [phone]   [facility]  [address], [place] Phone:  [phone], Fax:  [phone] Hours of Operation:  9 am - 6 pm, M-F.  Also accepts Medicaid/Medicare and self-pay.  [facility]  [address], [place] Phone: [phone], Fax: [phone]. Hours of Operation:  8:30 am - 5:30 pm, M-F.  Also accepts Medicaid and self-pay.  [facility] [place] [address], [place] Phone: [phone]   [facility] [address] Natasha Bence [place], [place] [phone], Ext. [phone] Mondays & Thursdays: 7-9 AM.  First 15 patients are seen on a first come, first serve basis.    Medicaid-accepting [facility] Providers:  Organization         Address  Phone   Notes  [facility] [address], [place] [phone] Also accepts self-pay patients.  [facility] [address] Laurell Josephs [place], [place]  [phone]   [facility] [address], [place] [phone]   [facility] [address] [place] [phone]   Renaye Rakers [address], [place]   [phone] Only accepts [place] patients after they have their name applied to their card.   Self-Pay (no insurance) in [facility]:  Organization         Address  Phone   Notes  Sickle Cell Patients, [facility] [address] [place], [place] [phone]   [facility] [address] [place], [place] [phone]   [facility]  [address], [place] [phone]   [facility]/Dr. Osei-Bonsu  [address] [place] or [address], [place] [phone] Phone number for both [place] and [place] locations is the same.  [facility] [address] [place] [phone]   [facility] [address], [place] or [address] [phone]   [facility] [address], [place] [phone], phone; [phone], fax Sees patients 1st and 3rd Saturday of every month.  Must not qualify for public or private insurance (i.e. Medicaid, Medicare, [place] Health Choice, Veterans' Benefits)  Household income should be no more than 200% of the poverty level The clinic cannot treat you if you are pregnant or think you are pregnant  Sexually transmitted diseases are not treated at the clinic.    Dental Care: Organization         Address  Phone  Notes  [facility] Department of [facility] [address] [place], [place] [phone] Accepts children up to age 58 who are enrolled in [place] or [place] Health Choice; pregnant women with a Medicaid card; and children who have applied for Medicaid or [place] Health Choice, but were declined, whose parents can pay a reduced fee at time of service.  [facility] Department of [facility]  [address], [place] [phone] Accepts children up to age 59 who are enrolled  in [place] or [place] Health Choice; pregnant women with a Medicaid card; and children who have applied for Medicaid or [place] Health Choice, but were declined, whose parents can pay a reduced fee at time of service.  [facility]  [address] [place], [place] [phone] Patients are seen by appointment only. Walk-ins are not accepted. [facility] will see patients 9 years of age and older. Monday - Tuesday (8am-5pm) Most Wednesdays (8:30-5pm) $30 per visit, cash  only  [facility] Adult Dental Access [facility]  [address], [facility] [phone] Patients are seen by appointment only. Walk-ins are not accepted. [facility] will see patients 51 years of age and older. One Wednesday Evening (Monthly: Volunteer Based).  $30 per visit, cash only  [employer]  [phone] for adults; Children under age 4, call Graduate Pediatric Dentistry at [phone]. Children aged 71-14, please call [phone] to request a pediatric application.  Dental services are provided in all areas of dental care including fillings, crowns and bridges, complete and partial dentures, implants, gum treatment, root canals, and extractions. Preventive care is also provided. Treatment is provided to both adults and children. Patients are selected via a lottery and there is often a waiting list.   [facility] [address] [place]  [phone] [email or web address]   [facility] [address] [place], [place] [phone], Ext. [phone] Second and Fourth Thursday of each month, opens at 6:30 AM; Clinic ends at 9 AM.  Patients are seen on a first-come first-served basis, and a limited number are seen during each clinic.   [facility]  [address] Ether Griffins [place], [place] [phone]   Eligibility Requirements You must have lived in [place], [place], or [place] counties for at least the last three months.   You cannot be  eligible for state or federal sponsored [place], including [employer], [place], or [employer].   You generally cannot be eligible for healthcare insurance through your employer.    How to apply: Eligibility screenings are held every Tuesday and Wednesday afternoon from 1:00 pm until 4:00 pm. You do not need an appointment for the interview!  [facility] [address], [place], [place] [phone]   [facility] Health Department  [phone]   [facility] Health Department  [phone]   [facility] Department  [phone]    Behavioral Health Resources in the Community: Intensive Outpatient Programs Organization         Address  Phone  Notes  [facility] [address] [place], [place] [phone]   [facility] Outpatient [address] [place], [place] [phone]   [facility] [address], [place], [place]  [phone]   [facility] Mental Health [address],  [place], [place] [phone] or [phone]   Substance Abuse Resources Organization         Address  Phone  Notes  [facility] Drug Services  [phone]   [facility]  [phone]   [facility]  [phone]   Floydene Flock  [phone]   Residential & Outpatient Substance Abuse Program  [phone]   Psychological Services Organization         Address  Phone  Notes  [facility]  [phone]   [facility]  [phone]   [facility] Mental Health [address] [place] [phone] or [phone]    Mobile Crisis Teams Organization         Address  Phone  Notes  [facility], Mobile Crisis Care Unit  [phone]   [facility]  [address] [place], [place] [phone]   Doristine Locks [address] [place] [phone]    Self-Help/Support Groups Organization          Address  Phone  Notes  [facility] [place] - variety of support groups  336- [phone] Call for more information  Narcotics Anonymous (NA), [facility] [address], [place]  2 meetings at this location   Statisticianesidential Treatment Programs Organization         Address  Phone  Notes  [facility] [address],    [place]  [phone]   [facility]  [address], [place] [phone], [place], [place] [phone]   [facility] [address], [place] [phone] Admissions: 8am-3pm M-F  [facility] [address]    [place], [place] [phone]   [facility] [address], [place], [place] [phone]   [facility] [address]  [place], [place] [phone]   [facility] - Intensive Outpatient [address], [place], [place] [phone]   [facility] ([facility] [address]  [place], [place] [phone] or [phone]   [facility] ([facility]) [address] [place], [place] [phone] Accepts Medicaid  [facility] [address]  [place] [phone] Substance Abuse/Addiction Treatment   [facility] Behavioral Health Resources Organization         Address  Phone  Notes  [facility]  [phone]   Angie FavaJulie Brannon, PhD [address] [place], [place]   [phone] or [phone]   [facility]   [address] [place], [place] [phone]) [phone]   [facility] [address], [place], [place] [phone]) [phone] Insurance/Medicaid/sponsorship through [facility]  [facility] [address]                                    [place], [place] [phone]) [phone] Therapy/tele-psych/case  [facility] [address].   [place], [place] [phone]    Dr. Lolly MustacheArfeen  [phone]   [facility]  [employer]  [facility] 1) [address], [place] 2) [address], [place] 3)  [address], [place] [phone](336) [phone]  [phone]   [facility] Child Abuse Hotline [phone] or [phone](336) [phone] (After Hours)

## 2015-10-18 NOTE — ED Notes (Signed)
Pt started having chest pain last night before bed. Pt has had intermittent SOB. VS stable in triage. NAD noted. Pt denies taking any ASA or nitro. EKG printed and given to MD.

## 2015-10-18 NOTE — ED Notes (Signed)
Pt reports that he stopped smoking in the last 20 days and experienced chest pain last night  He reports a cough.

## 2015-10-18 NOTE — ED Notes (Signed)
Pt reports pain at 8/10. He is conversant laughing with relaxed facial expressions while speaking and at rest.
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
## 2015-10-18 NOTE — ED Notes (Signed)
Pt verbalizes understanding of diagnosis, med regime, follow up and smoking cessation

## 2015-11-12 ENCOUNTER — Encounter (HOSPITAL_COMMUNITY): Payer: Self-pay | Admitting: *Deleted

## 2015-11-12 ENCOUNTER — Emergency Department (HOSPITAL_COMMUNITY)
Admission: EM | Admit: 2015-11-12 | Discharge: 2015-11-12 | Disposition: A | Discharge status: Home / Self Care | Payer: Self-pay | Attending: Emergency Medicine | Admitting: Emergency Medicine

## 2015-11-12 DIAGNOSIS — I2510 Atherosclerotic heart disease of native coronary artery without angina pectoris: Secondary | ICD-10-CM | POA: Insufficient documentation

## 2015-11-12 DIAGNOSIS — F329 Major depressive disorder, single episode, unspecified: Secondary | ICD-10-CM | POA: Insufficient documentation

## 2015-11-12 DIAGNOSIS — Y999 Unspecified external cause status: Secondary | ICD-10-CM | POA: Insufficient documentation

## 2015-11-12 DIAGNOSIS — W19XXXA Unspecified fall, initial encounter: Secondary | ICD-10-CM | POA: Insufficient documentation

## 2015-11-12 DIAGNOSIS — Y929 Unspecified place or not applicable: Secondary | ICD-10-CM | POA: Insufficient documentation

## 2015-11-12 DIAGNOSIS — S00501D Unspecified superficial injury of lip, subsequent encounter: Secondary | ICD-10-CM | POA: Insufficient documentation

## 2015-11-12 DIAGNOSIS — Z87891 Personal history of nicotine dependence: Secondary | ICD-10-CM | POA: Insufficient documentation

## 2015-11-12 DIAGNOSIS — S0993XD Unspecified injury of face, subsequent encounter: Secondary | ICD-10-CM

## 2015-11-12 DIAGNOSIS — Y939 Activity, unspecified: Secondary | ICD-10-CM | POA: Insufficient documentation

## 2015-11-12 NOTE — ED Notes (Signed)
Pt states he passed out about a week ago and busted his lip. Pt wants his lip to be checked.
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
## 2016-05-31 ENCOUNTER — Emergency Department (HOSPITAL_COMMUNITY): Payer: Self-pay

## 2016-05-31 ENCOUNTER — Encounter (HOSPITAL_COMMUNITY): Payer: Self-pay

## 2016-05-31 ENCOUNTER — Emergency Department (HOSPITAL_COMMUNITY)
Admission: EM | Admit: 2016-05-31 | Discharge: 2016-05-31 | Disposition: A | Discharge status: Home / Self Care | Payer: Self-pay | Attending: Emergency Medicine | Admitting: Emergency Medicine

## 2016-05-31 DIAGNOSIS — R110 Nausea: Secondary | ICD-10-CM | POA: Insufficient documentation

## 2016-05-31 DIAGNOSIS — Z87891 Personal history of nicotine dependence: Secondary | ICD-10-CM | POA: Insufficient documentation

## 2016-05-31 DIAGNOSIS — I2510 Atherosclerotic heart disease of native coronary artery without angina pectoris: Secondary | ICD-10-CM | POA: Insufficient documentation

## 2016-05-31 DIAGNOSIS — Z45010 Encounter for checking and testing of cardiac pacemaker pulse generator [battery]: Secondary | ICD-10-CM | POA: Insufficient documentation

## 2016-05-31 DIAGNOSIS — I951 Orthostatic hypotension: Secondary | ICD-10-CM | POA: Insufficient documentation

## 2016-05-31 LAB — CBC WITH DIFFERENTIAL/PLATELET
Basophils Absolute: 0 10*3/uL (ref 0.0–0.1)
Basophils Relative: 0 %
EOS ABS: 0 10*3/uL (ref 0.0–0.7)
EOS PCT: 1 %
HCT: 41 % (ref 39.0–52.0)
Hemoglobin: 13.7 g/dL (ref 13.0–17.0)
LYMPHS ABS: 1.4 10*3/uL (ref 0.7–4.0)
Lymphocytes Relative: 25 %
MCH: 33.7 pg (ref 26.0–34.0)
MCHC: 33.4 g/dL (ref 30.0–36.0)
MCV: 100.7 fL — ABNORMAL HIGH (ref 78.0–100.0)
MONOS PCT: 10 %
Monocytes Absolute: 0.6 10*3/uL (ref 0.1–1.0)
Neutro Abs: 3.7 10*3/uL (ref 1.7–7.7)
Neutrophils Relative %: 64 %
Platelets: 159 10*3/uL (ref 150–400)
RBC: 4.07 MIL/uL — ABNORMAL LOW (ref 4.22–5.81)
RDW: 13.3 % (ref 11.5–15.5)
WBC: 5.8 10*3/uL (ref 4.0–10.5)

## 2016-05-31 LAB — BASIC METABOLIC PANEL
ANION GAP: 7 (ref 5–15)
BUN: 13 mg/dL (ref 6–20)
CHLORIDE: 103 mmol/L (ref 101–111)
CO2: 27 mmol/L (ref 22–32)
Calcium: 9.1 mg/dL (ref 8.9–10.3)
Creatinine, Ser: 0.9 mg/dL (ref 0.61–1.24)
GFR calc Af Amer: >60 mL/min (ref >60)
GLUCOSE: 112 mg/dL — AB (ref 65–99)
POTASSIUM: 4 mmol/L (ref 3.5–5.1)
Sodium: 137 mmol/L (ref 135–145)

## 2016-05-31 LAB — TROPONIN I

## 2016-05-31 MED ORDER — BACITRACIN-NEOMYCIN-POLYMYXIN 400-5-5000 EX OINT
TOPICAL_OINTMENT | Freq: Once | CUTANEOUS | Status: DC
  Filled 2016-05-31: qty 1

## 2016-05-31 MED ORDER — SODIUM CHLORIDE 0.9 % IV BOLUS (SEPSIS): 1000.0000 mL | Freq: Once | INTRAVENOUS | Status: DC

## 2016-05-31 MED ORDER — SODIUM CHLORIDE 0.9 % IV BOLUS (SEPSIS)
1000.0000 mL | Freq: Once | INTRAVENOUS | Status: AC
  Administered 2016-05-31: 1000 mL via INTRAVENOUS

## 2016-05-31 NOTE — Discharge Instructions (Signed)
Dr. Junius ArgyleKoneswaran's (cardiologist) office will call you with an appointment for a new pacemaker.

## 2016-05-31 NOTE — ED Triage Notes (Signed)
Pt c/o generalized weakness and dizziness since yesterday.  Reports started having chest pain at 3 am.  Says pain is sharp and worse with movement and deep breaths.  EMS administered zofran for nausea.
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
## 2016-06-01 ENCOUNTER — Encounter: Payer: Self-pay | Admitting: Cardiovascular Disease

## 2016-06-01 ENCOUNTER — Telehealth: Payer: Self-pay | Admitting: *Deleted

## 2016-06-01 NOTE — Telephone Encounter (Signed)
Called patient to schedule appt [facility].  Patient agreeable to appointment with Dr. Johney FrameAllred on 06/07/16 at 11:15am.  He is aware that [facility].  Patient is appreciative of call and denies additional questions or concerns at this time.

## 2016-06-06 ENCOUNTER — Encounter: Payer: Self-pay | Admitting: Internal Medicine

## 2016-06-07 ENCOUNTER — Telehealth: Payer: Self-pay | Admitting: Internal Medicine

## 2016-06-07 ENCOUNTER — Encounter: Payer: Self-pay | Admitting: Internal Medicine

## 2016-06-07 ENCOUNTER — Ambulatory Visit (INDEPENDENT_AMBULATORY_CARE_PROVIDER_SITE_OTHER): Payer: Self-pay | Admitting: Internal Medicine

## 2016-06-07 VITALS — BP 120/78 | HR 74 | Ht 71.0 in | Wt 158.6 lb

## 2016-06-07 DIAGNOSIS — I459 Conduction disorder, unspecified: Secondary | ICD-10-CM

## 2016-06-07 DIAGNOSIS — Z45018 Encounter for adjustment and management of other part of cardiac pacemaker: Secondary | ICD-10-CM

## 2016-06-07 DIAGNOSIS — I495 Sick sinus syndrome: Secondary | ICD-10-CM

## 2016-06-07 LAB — CBC WITH DIFFERENTIAL/PLATELET
Basophils Absolute: 0 cells/uL (ref 0–200)
Basophils Relative: 0 %
EOS ABS: 204 {cells}/uL (ref 15–500)
Eosinophils Relative: 2 %
HCT: 39.6 % (ref 38.5–50.0)
Hemoglobin: 12.9 g/dL — ABNORMAL LOW (ref 13.2–17.1)
LYMPHS PCT: 17 %
Lymphs Abs: 1734 cells/uL (ref 850–3900)
MCH: 33.6 pg — AB (ref 27.0–33.0)
MCHC: 32.6 g/dL (ref 32.0–36.0)
MCV: 103.1 fL — AB (ref 80.0–100.0)
MONOS PCT: 7 %
MPV: 11 fL (ref 7.5–12.5)
Monocytes Absolute: 714 cells/uL (ref 200–950)
Neutro Abs: 7548 cells/uL (ref 1500–7800)
Neutrophils Relative %: 74 %
PLATELETS: 188 10*3/uL (ref 140–400)
RBC: 3.84 MIL/uL — ABNORMAL LOW (ref 4.20–5.80)
RDW: 13.5 % (ref 11.0–15.0)
WBC: 10.2 10*3/uL (ref 3.8–10.8)

## 2016-06-07 LAB — BASIC METABOLIC PANEL
BUN: 14 mg/dL (ref 7–25)
CHLORIDE: 105 mmol/L (ref 98–110)
CO2: 30 mmol/L (ref 20–31)
CREATININE: 1.3 mg/dL (ref 0.70–1.33)
Calcium: 9.4 mg/dL (ref 8.6–10.3)
Glucose, Bld: 67 mg/dL (ref 65–99)
Potassium: 4.4 mmol/L (ref 3.5–5.3)
Sodium: 142 mmol/L (ref 135–146)

## 2016-06-07 NOTE — Telephone Encounter (Signed)
error 
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
## 2016-06-07 NOTE — Patient Instructions (Signed)
Medication Instructions:  Your physician recommends that you continue on your current medications as directed. Please refer to the Current Medication list given to you today.   Labwork: Your physician recommends that you return for lab work today   Testing/Procedures: Pacemaker generator change on 06/12/16  [facility] at 9:30[facility] Do not eat or drink after midnight the night prior to the procedure Do not take any medications the morning of procedure Will need someone to drive you home    Follow-Up: Your physician recommends that you schedule a follow-up appointment in: 10-14 days from 06/12/16 in device clinic for wound check and 3 months from 10/9/17with Dr Johney FrameAllred   Any Other Special Instructions Will Be Listed Below (If Applicable).     If you need a refill on your cardiac medications before your next appointment, please call your pharmacy.

## 2016-06-09 ENCOUNTER — Telehealth: Payer: Self-pay | Admitting: Internal Medicine

## 2016-06-09 NOTE — Telephone Encounter (Signed)
I spoke with patient. He states he has 2 questions.  1.) Pt states he would like to apply for disability. I advised him to obtain the appropriate paperwork, fill out his portion, bring to office for doctor to review and complete at his discretion.  2.) Pt asked how many of "these procedures" Dr Johney FrameAllred has preformed. I advised him he has preformed hundreds of gen changes. Pt states "as long as I'm not his first one".  Pt voiced understanding and agreed with plan.

## 2016-06-09 NOTE — Telephone Encounter (Signed)
New Message:     Please call,question about his procedure he is going to have.

## 2016-06-12 ENCOUNTER — Ambulatory Visit (HOSPITAL_COMMUNITY): Admission: RE | Admit: 2016-06-12 | Payer: Self-pay | Source: Ambulatory Visit | Admitting: Internal Medicine

## 2016-06-12 ENCOUNTER — Encounter (HOSPITAL_COMMUNITY): Admission: RE | Payer: Self-pay | Source: Ambulatory Visit

## 2016-06-12 SURGERY — PPM/BIV PPM GENERATOR CHANGEOUT

## 2016-06-13 ENCOUNTER — Telehealth: Payer: Self-pay | Admitting: Internal Medicine

## 2016-06-13 ENCOUNTER — Encounter: Payer: Self-pay | Admitting: Internal Medicine

## 2016-06-13 NOTE — Telephone Encounter (Signed)
Tried both numbers listed and neither are working to get his generator change out rescheduled

## 2016-06-13 NOTE — Telephone Encounter (Signed)
New Message:    Pt wants to know when is supposed to have his pacemaker put in?

## 2016-06-13 NOTE — Telephone Encounter (Signed)
New Message  Pt voiced wanting to know about the implant he was suppose to have yesterday.  Pt voiced he got his days mixed up and wanting to call to reschedule.  Please f/u

## 2016-06-13 NOTE — Telephone Encounter (Signed)
Records received from [facility]-placed in chart prep.

## 2016-06-14 NOTE — Telephone Encounter (Signed)
Spoke with patient and rescheduled his generator change for 06/20/16.  He is aware to be at the hospital at 12:30 and aware he can have a lite breakfast but nothing after 9am.  He has not completed the paper work for orange card as [place]

## 2016-06-14 NOTE — Telephone Encounter (Signed)
Call Documentation   Laurence FerrariSylvia M Willis at 06/13/2016 12:42 PM   Status: Signed    New Message:    Pt wants to know when is supposed to have his pacemaker put in?

## 2016-06-14 NOTE — Telephone Encounter (Signed)
This encounter was created in error - please disregard.

## 2016-06-19 ENCOUNTER — Telehealth: Payer: Self-pay | Admitting: Internal Medicine

## 2016-06-19 NOTE — Telephone Encounter (Signed)
New message     Patient calling has upcoming procedure with Dr. Johney FrameAllred on  10.17.2017  - is requesting to be put asleep - does not want to be [place].

## 2016-06-19 NOTE — Telephone Encounter (Signed)
Spoke with the patient and let him know the MD will take care of him.  He will be giving medication to help him relax and should not feel anything

## 2016-06-20 ENCOUNTER — Encounter (HOSPITAL_COMMUNITY)
Admission: RE | Disposition: A | Discharge status: Home / Self Care | Payer: Self-pay | Source: Ambulatory Visit | Attending: Internal Medicine

## 2016-06-20 ENCOUNTER — Ambulatory Visit (HOSPITAL_COMMUNITY)
Admission: RE | Admit: 2016-06-20 | Discharge: 2016-06-20 | Disposition: A | Discharge status: Home / Self Care | Payer: Self-pay | Source: Ambulatory Visit | Attending: Internal Medicine | Admitting: Internal Medicine

## 2016-06-20 DIAGNOSIS — Z560 Unemployment, unspecified: Secondary | ICD-10-CM | POA: Insufficient documentation

## 2016-06-20 DIAGNOSIS — I495 Sick sinus syndrome: Secondary | ICD-10-CM

## 2016-06-20 DIAGNOSIS — F329 Major depressive disorder, single episode, unspecified: Secondary | ICD-10-CM | POA: Insufficient documentation

## 2016-06-20 DIAGNOSIS — Z87891 Personal history of nicotine dependence: Secondary | ICD-10-CM | POA: Insufficient documentation

## 2016-06-20 DIAGNOSIS — Z9119 Patient's noncompliance with other medical treatment and regimen: Secondary | ICD-10-CM | POA: Insufficient documentation

## 2016-06-20 DIAGNOSIS — Z45010 Encounter for checking and testing of cardiac pacemaker pulse generator [battery]: Secondary | ICD-10-CM | POA: Insufficient documentation

## 2016-06-20 DIAGNOSIS — F259 Schizoaffective disorder, unspecified: Secondary | ICD-10-CM | POA: Insufficient documentation

## 2016-06-20 DIAGNOSIS — R55 Syncope and collapse: Secondary | ICD-10-CM | POA: Diagnosis present

## 2016-06-20 DIAGNOSIS — I2510 Atherosclerotic heart disease of native coronary artery without angina pectoris: Secondary | ICD-10-CM | POA: Insufficient documentation

## 2016-06-20 HISTORY — PX: EP IMPLANTABLE DEVICE: SHX172B

## 2016-06-20 LAB — SURGICAL PCR SCREEN
MRSA, PCR: NEGATIVE
Staphylococcus aureus: NEGATIVE

## 2016-06-20 SURGERY — PPM/BIV PPM GENERATOR CHANGEOUT
Anesthesia: LOCAL

## 2016-06-20 MED ORDER — LIDOCAINE HCL (PF) 1 % IJ SOLN
INTRAMUSCULAR | Status: AC
  Filled 2016-06-20: qty 60

## 2016-06-20 MED ORDER — MUPIROCIN 2 % EX OINT
TOPICAL_OINTMENT | CUTANEOUS | Status: AC
  Administered 2016-06-20: 14:00:00
  Filled 2016-06-20: qty 22

## 2016-06-20 MED ORDER — SODIUM CHLORIDE 0.9% FLUSH: 3.0000 mL | INTRAVENOUS | Status: DC | PRN

## 2016-06-20 MED ORDER — ONDANSETRON HCL 4 MG/2ML IJ SOLN: 4.0000 mg | Freq: Four times a day (QID) | INTRAMUSCULAR | Status: DC | PRN

## 2016-06-20 MED ORDER — SODIUM CHLORIDE 0.9% FLUSH: 3.0000 mL | Freq: Two times a day (BID) | INTRAVENOUS | Status: DC

## 2016-06-20 MED ORDER — SODIUM CHLORIDE 0.9 % IV SOLN
250.0000 mL | INTRAVENOUS | Status: DC | PRN
Start: 2016-06-20 — End: 2016-06-20

## 2016-06-20 MED ORDER — SODIUM CHLORIDE 0.9 % IR SOLN
80.0000 mg | Status: AC
  Administered 2016-06-20: 80 mg

## 2016-06-20 MED ORDER — LIDOCAINE HCL (PF) 1 % IJ SOLN
INTRAMUSCULAR | Status: DC | PRN
  Administered 2016-06-20: 23 mL

## 2016-06-20 MED ORDER — GENTAMICIN SULFATE 40 MG/ML IJ SOLN
INTRAMUSCULAR | Status: AC
  Filled 2016-06-20: qty 2

## 2016-06-20 MED ORDER — SODIUM CHLORIDE 0.9 % IV SOLN
INTRAVENOUS | Status: DC
  Administered 2016-06-20: 14:00:00 via INTRAVENOUS

## 2016-06-20 MED ORDER — CHLORHEXIDINE GLUCONATE 4 % EX LIQD: 60.0000 mL | Freq: Once | CUTANEOUS | Status: DC

## 2016-06-20 MED ORDER — CEFAZOLIN SODIUM-DEXTROSE 2-4 GM/100ML-% IV SOLN
2.0000 g | INTRAVENOUS | Status: AC
  Administered 2016-06-20: 2 g via INTRAVENOUS

## 2016-06-20 MED ORDER — CEFAZOLIN SODIUM-DEXTROSE 2-4 GM/100ML-% IV SOLN
INTRAVENOUS | Status: AC
  Filled 2016-06-20: qty 100

## 2016-06-20 MED ORDER — ACETAMINOPHEN 325 MG PO TABS
325.0000 mg | ORAL_TABLET | ORAL | Status: DC | PRN
  Filled 2016-06-20: qty 2

## 2016-06-20 SURGICAL SUPPLY — 4 items
CABLE SURGICAL S-101-97-12 (CABLE) ×1 IMPLANT
PACEMAKER ASSURITY DR-RF (Pacemaker) ×2 IMPLANT
PAD DEFIB LIFELINK (PAD) ×1 IMPLANT
TRAY PACEMAKER INSERTION (PACKS) ×1 IMPLANT
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
## 2016-06-20 NOTE — Interval H&P Note (Signed)
History and Physical Interval Note:  06/20/2016 1:12 PM  Harry Zhang  has presented today for surgery, with the diagnosis of ERI  The various methods of treatment have been discussed with the patient and family. After consideration of risks, benefits and other options for treatment, the patient has consented to  Procedure(s): PPM Generator Changeout (N/A) as a surgical intervention .  The patient's history has been reviewed, patient examined, no change in status, stable for surgery.  I have reviewed the patient's chart and labs.  Questions were answered to the patient's satisfaction.     Hillis RangeJames Cannon Quinton

## 2016-06-20 NOTE — Discharge Instructions (Signed)

## 2016-06-21 ENCOUNTER — Encounter (HOSPITAL_COMMUNITY): Payer: Self-pay | Admitting: Internal Medicine

## 2016-06-21 MED FILL — Sodium Chloride Irrigation Soln 0.9%: Qty: 500 | Status: AC

## 2016-06-21 MED FILL — Gentamicin Sulfate Inj 40 MG/ML: INTRAMUSCULAR | Qty: 2 | Status: AC

## 2016-06-26 ENCOUNTER — Ambulatory Visit: Payer: Self-pay

## 2016-06-29 ENCOUNTER — Ambulatory Visit (INDEPENDENT_AMBULATORY_CARE_PROVIDER_SITE_OTHER): Payer: Self-pay | Admitting: *Deleted

## 2016-06-29 ENCOUNTER — Encounter: Payer: Self-pay | Admitting: Internal Medicine

## 2016-06-29 DIAGNOSIS — Z950 Presence of cardiac pacemaker: Secondary | ICD-10-CM

## 2016-06-29 DIAGNOSIS — I495 Sick sinus syndrome: Secondary | ICD-10-CM

## 2016-06-29 LAB — CUP PACEART INCLINIC DEVICE CHECK
Brady Statistic RA Percent Paced: 31 %
Date Time Interrogation Session: 20171026153832
Implantable Lead Location: 753859
Lead Channel Impedance Value: 437.5 Ohm
Lead Channel Pacing Threshold Pulse Width: 0.4 ms
Lead Channel Sensing Intrinsic Amplitude: 4.5 mV
Lead Channel Sensing Intrinsic Amplitude: 5 mV
Lead Channel Setting Pacing Amplitude: 2 V
Lead Channel Setting Pacing Pulse Width: 0.4 ms
MDC IDC LEAD IMPLANT DT: 20070119
MDC IDC LEAD IMPLANT DT: 20070119
MDC IDC LEAD LOCATION: 753860
MDC IDC MSMT BATTERY REMAINING LONGEVITY: 140.4
MDC IDC MSMT BATTERY VOLTAGE: 3.11 V
MDC IDC MSMT LEADCHNL RA PACING THRESHOLD AMPLITUDE: 0.5 V
MDC IDC MSMT LEADCHNL RA PACING THRESHOLD PULSEWIDTH: 0.4 ms
MDC IDC MSMT LEADCHNL RV IMPEDANCE VALUE: 450 Ohm
MDC IDC MSMT LEADCHNL RV PACING THRESHOLD AMPLITUDE: 0.75 V
MDC IDC PG SERIAL: 7961150
MDC IDC SET LEADCHNL RV PACING AMPLITUDE: 2.5 V
MDC IDC SET LEADCHNL RV SENSING SENSITIVITY: 2 mV
MDC IDC STAT BRADY RV PERCENT PACED: 1.3 %
Pulse Gen Model: 2272

## 2016-06-29 NOTE — Progress Notes (Signed)
Wound check appointment. Steri-strips removed. Wound without redness or edema. Incision edges approximated, wound well healed. Normal device function. Thresholds, sensing, and impedances consistent with implant measurements. Device programmed at chronic outputs. Histogram distribution appropriate for patient and level of activity. No mode switches or high ventricular rates noted. Patient educated about wound care, arm mobility, lifting restrictions, and home monitor. ROV with JA/E on 10/06/16.

## 2016-07-05 ENCOUNTER — Telehealth: Payer: Self-pay | Admitting: Internal Medicine

## 2016-07-05 NOTE — Telephone Encounter (Signed)
Patient called in yesterday to Medical Records Dept asking for copies Pacer Records. I called Patient back this am On home phone VM ( [phone] has not been set up cannot leave VM.  Also tried Cell [phone] number has been changed/disconnected Cannot leave message there neither. I am unable to speak with patient.

## 2016-08-29 ENCOUNTER — Telehealth: Payer: Self-pay | Admitting: Internal Medicine

## 2016-08-29 NOTE — Telephone Encounter (Signed)
Received records request Disability Determination Services, forwarded to [employer] for processing.  

## 2016-10-06 ENCOUNTER — Encounter: Payer: Self-pay | Admitting: Internal Medicine

## 2016-10-06 ENCOUNTER — Ambulatory Visit (INDEPENDENT_AMBULATORY_CARE_PROVIDER_SITE_OTHER): Payer: Self-pay | Admitting: Internal Medicine

## 2016-10-06 VITALS — BP 122/72 | HR 67 | Ht 71.0 in | Wt 164.2 lb

## 2016-10-06 DIAGNOSIS — Z950 Presence of cardiac pacemaker: Secondary | ICD-10-CM

## 2016-10-06 DIAGNOSIS — I495 Sick sinus syndrome: Secondary | ICD-10-CM

## 2016-10-06 NOTE — Patient Instructions (Signed)
Medication Instructions:  Continue all current medications.  Labwork: none  Testing/Procedures: none  Follow-Up: Your physician wants you to follow up in:  1 year.  You will receive a reminder letter in the mail one-two months in advance.  If you don't receive a letter, please call our office to schedule the follow up appointment - DR. ALLRED   Any Other Special Instructions Will Be Listed Below (If Applicable). Remote monitoring is used to monitor your Pacemaker of ICD from home. This monitoring reduces the number of office visits required to check your device to one time per year. It allows us to keep an eye on the functioning of your device to ensure it is working properly. You are scheduled for a device check from home on 01/08/17. You may send your transmission at any time that day. If you have a wireless device, the transmission will be sent automatically. After your physician reviews your transmission, you will receive a postcard with your next transmission date.  If you need a refill on your cardiac medications before your next appointment, please call your pharmacy.
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
## 2016-10-09 NOTE — Addendum Note (Signed)
Addended by: Burman NievesASHWORTH, Dupree Givler T on: 10/09/2016 07:27 AM   Modules accepted: Orders

## 2016-10-23 ENCOUNTER — Other Ambulatory Visit: Payer: Self-pay | Admitting: Internal Medicine

## 2017-01-08 ENCOUNTER — Ambulatory Visit (INDEPENDENT_AMBULATORY_CARE_PROVIDER_SITE_OTHER): Payer: Self-pay | Admitting: *Deleted

## 2017-01-08 DIAGNOSIS — I495 Sick sinus syndrome: Secondary | ICD-10-CM

## 2017-01-09 NOTE — Progress Notes (Signed)
Remote pacemaker transmission.   

## 2017-01-10 LAB — CUP PACEART REMOTE DEVICE CHECK
Battery Remaining Longevity: 124 mo
Battery Remaining Percentage: 95.5 %
Battery Voltage: 3.02 V
Brady Statistic AP VS Percent: 12 %
Brady Statistic RA Percent Paced: 12 %
Brady Statistic RV Percent Paced: 1 %
Implantable Lead Implant Date: 20070119
Implantable Lead Location: 753860
Implantable Pulse Generator Implant Date: 20171017
Lead Channel Impedance Value: 450 Ohm
Lead Channel Impedance Value: 490 Ohm
Lead Channel Pacing Threshold Amplitude: 0.75 V
Lead Channel Pacing Threshold Amplitude: 0.75 V
Lead Channel Pacing Threshold Pulse Width: 0.4 ms
Lead Channel Sensing Intrinsic Amplitude: 4.3 mV
Lead Channel Setting Sensing Sensitivity: 2 mV
MDC IDC LEAD IMPLANT DT: 20070119
MDC IDC LEAD LOCATION: 753859
MDC IDC MSMT LEADCHNL RV PACING THRESHOLD PULSEWIDTH: 0.4 ms
MDC IDC MSMT LEADCHNL RV SENSING INTR AMPL: 4.6 mV
MDC IDC PG SERIAL: 7961150
MDC IDC SESS DTM: 20180507060031
MDC IDC SET LEADCHNL RA PACING AMPLITUDE: 2 V
MDC IDC SET LEADCHNL RV PACING AMPLITUDE: 2.5 V
MDC IDC SET LEADCHNL RV PACING PULSEWIDTH: 0.4 ms
MDC IDC STAT BRADY AP VP PERCENT: 1 %
MDC IDC STAT BRADY AS VP PERCENT: 1 %
MDC IDC STAT BRADY AS VS PERCENT: 88 %

## 2017-01-12 ENCOUNTER — Encounter: Payer: Self-pay | Admitting: Cardiology

## 2017-03-28 ENCOUNTER — Telehealth: Payer: Self-pay | Admitting: *Deleted

## 2017-03-28 NOTE — Telephone Encounter (Signed)
Patient called to inform our office that his trazodone dose was increased to 200 mg at bedtime and he wanted to make sure this was okay for him to take. Patient advised that this message would be sent to our pharmacist for advice. Patient verbalized understanding.

## 2017-03-28 NOTE — Telephone Encounter (Signed)
Patient informed and verbalized understanding of plan. 

## 2017-03-28 NOTE — Telephone Encounter (Signed)
Ok for him to take higher dose of trazodone - would have pt monitor for any increase in blood pressure, dry mouth, blurred vision, or fatigue since these side effects may be more pronounced with higher dose.

## 2017-03-29 ENCOUNTER — Emergency Department (HOSPITAL_COMMUNITY)
Admission: EM | Admit: 2017-03-29 | Discharge: 2017-03-29 | Disposition: A | Discharge status: Home / Self Care | Payer: Self-pay | Attending: Emergency Medicine | Admitting: Emergency Medicine

## 2017-03-29 ENCOUNTER — Emergency Department (HOSPITAL_COMMUNITY): Payer: Self-pay

## 2017-03-29 ENCOUNTER — Encounter (HOSPITAL_COMMUNITY): Payer: Self-pay | Admitting: Emergency Medicine

## 2017-03-29 DIAGNOSIS — Z950 Presence of cardiac pacemaker: Secondary | ICD-10-CM | POA: Insufficient documentation

## 2017-03-29 DIAGNOSIS — R079 Chest pain, unspecified: Secondary | ICD-10-CM

## 2017-03-29 DIAGNOSIS — I2510 Atherosclerotic heart disease of native coronary artery without angina pectoris: Secondary | ICD-10-CM | POA: Insufficient documentation

## 2017-03-29 DIAGNOSIS — Z87891 Personal history of nicotine dependence: Secondary | ICD-10-CM | POA: Insufficient documentation

## 2017-03-29 DIAGNOSIS — R0789 Other chest pain: Secondary | ICD-10-CM | POA: Insufficient documentation

## 2017-03-29 DIAGNOSIS — Z79899 Other long term (current) drug therapy: Secondary | ICD-10-CM | POA: Insufficient documentation

## 2017-03-29 LAB — BASIC METABOLIC PANEL
ANION GAP: 7 (ref 5–15)
BUN: 13 mg/dL (ref 6–20)
CALCIUM: 8.9 mg/dL (ref 8.9–10.3)
CHLORIDE: 102 mmol/L (ref 101–111)
CO2: 29 mmol/L (ref 22–32)
CREATININE: 0.96 mg/dL (ref 0.61–1.24)
GFR calc non Af Amer: >60 mL/min (ref >60)
Glucose, Bld: 87 mg/dL (ref 65–99)
Potassium: 3.5 mmol/L (ref 3.5–5.1)
SODIUM: 138 mmol/L (ref 135–145)

## 2017-03-29 LAB — CBC WITH DIFFERENTIAL/PLATELET
BASOS ABS: 0 10*3/uL (ref 0.0–0.1)
BASOS PCT: 0 %
EOS ABS: 0.2 10*3/uL (ref 0.0–0.7)
Eosinophils Relative: 3 %
HEMATOCRIT: 40.7 % (ref 39.0–52.0)
HEMOGLOBIN: 14 g/dL (ref 13.0–17.0)
Lymphocytes Relative: 33 %
Lymphs Abs: 2.2 10*3/uL (ref 0.7–4.0)
MCH: 34.2 pg — ABNORMAL HIGH (ref 26.0–34.0)
MCHC: 34.4 g/dL (ref 30.0–36.0)
MCV: 99.5 fL (ref 78.0–100.0)
MONOS PCT: 10 %
Monocytes Absolute: 0.7 10*3/uL (ref 0.1–1.0)
NEUTROS ABS: 3.7 10*3/uL (ref 1.7–7.7)
NEUTROS PCT: 54 %
Platelets: 145 10*3/uL — ABNORMAL LOW (ref 150–400)
RBC: 4.09 MIL/uL — ABNORMAL LOW (ref 4.22–5.81)
RDW: 13.6 % (ref 11.5–15.5)
WBC: 6.9 10*3/uL (ref 4.0–10.5)

## 2017-03-29 LAB — I-STAT TROPONIN, ED: TROPONIN I, POC: 0 ng/mL (ref 0.00–0.08)

## 2017-03-29 MED ORDER — TRAMADOL HCL 50 MG PO TABS: 50.0000 mg | ORAL_TABLET | Freq: Four times a day (QID) | ORAL | 0 refills | Status: DC | PRN
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
## 2017-04-09 ENCOUNTER — Ambulatory Visit (INDEPENDENT_AMBULATORY_CARE_PROVIDER_SITE_OTHER): Payer: Self-pay | Admitting: *Deleted

## 2017-04-09 DIAGNOSIS — I495 Sick sinus syndrome: Secondary | ICD-10-CM

## 2017-04-10 NOTE — Progress Notes (Signed)
Remote pacemaker transmission.   

## 2017-04-11 ENCOUNTER — Encounter: Payer: Self-pay | Admitting: Cardiology

## 2017-04-13 LAB — CUP PACEART REMOTE DEVICE CHECK
Battery Remaining Longevity: 122 mo
Battery Remaining Percentage: 95.5 %
Battery Voltage: 3.01 V
Brady Statistic AS VP Percent: 1 %
Brady Statistic AS VS Percent: 87 %
Date Time Interrogation Session: 20180806085437
Implantable Lead Implant Date: 20070119
Implantable Lead Implant Date: 20070119
Implantable Lead Location: 753859
Lead Channel Impedance Value: 410 Ohm
Lead Channel Pacing Threshold Amplitude: 0.75 V
Lead Channel Pacing Threshold Pulse Width: 0.4 ms
Lead Channel Sensing Intrinsic Amplitude: 4.2 mV
Lead Channel Setting Pacing Amplitude: 2.5 V
MDC IDC LEAD LOCATION: 753860
MDC IDC MSMT LEADCHNL RA PACING THRESHOLD PULSEWIDTH: 0.4 ms
MDC IDC MSMT LEADCHNL RA SENSING INTR AMPL: 3.5 mV
MDC IDC MSMT LEADCHNL RV IMPEDANCE VALUE: 480 Ohm
MDC IDC MSMT LEADCHNL RV PACING THRESHOLD AMPLITUDE: 0.75 V
MDC IDC PG IMPLANT DT: 20171017
MDC IDC SET LEADCHNL RA PACING AMPLITUDE: 2 V
MDC IDC SET LEADCHNL RV PACING PULSEWIDTH: 0.4 ms
MDC IDC SET LEADCHNL RV SENSING SENSITIVITY: 2 mV
MDC IDC STAT BRADY AP VP PERCENT: 1 %
MDC IDC STAT BRADY AP VS PERCENT: 12 %
MDC IDC STAT BRADY RA PERCENT PACED: 12 %
MDC IDC STAT BRADY RV PERCENT PACED: 1 %
Pulse Gen Model: 2272
Pulse Gen Serial Number: 7961150

## 2017-04-14 ENCOUNTER — Encounter (HOSPITAL_COMMUNITY): Payer: Self-pay | Admitting: Emergency Medicine

## 2017-04-14 ENCOUNTER — Emergency Department (HOSPITAL_COMMUNITY)
Admission: EM | Admit: 2017-04-14 | Discharge: 2017-04-14 | Disposition: A | Discharge status: Home / Self Care | Payer: Self-pay | Attending: Emergency Medicine | Admitting: Emergency Medicine

## 2017-04-14 DIAGNOSIS — Z950 Presence of cardiac pacemaker: Secondary | ICD-10-CM | POA: Insufficient documentation

## 2017-04-14 DIAGNOSIS — G43709 Chronic migraine without aura, not intractable, without status migrainosus: Secondary | ICD-10-CM | POA: Insufficient documentation

## 2017-04-14 DIAGNOSIS — I2510 Atherosclerotic heart disease of native coronary artery without angina pectoris: Secondary | ICD-10-CM | POA: Insufficient documentation

## 2017-04-14 DIAGNOSIS — Z87891 Personal history of nicotine dependence: Secondary | ICD-10-CM | POA: Insufficient documentation

## 2017-04-14 DIAGNOSIS — Z79899 Other long term (current) drug therapy: Secondary | ICD-10-CM | POA: Insufficient documentation

## 2017-04-14 MED ORDER — ACETAMINOPHEN 325 MG PO TABS
650.0000 mg | ORAL_TABLET | Freq: Once | ORAL | Status: AC
Start: 2017-04-14 — End: 2017-04-14
  Administered 2017-04-14: 650 mg via ORAL

## 2017-04-14 MED ORDER — PROCHLORPERAZINE EDISYLATE 5 MG/ML IJ SOLN
10.0000 mg | Freq: Once | INTRAMUSCULAR | Status: AC
  Administered 2017-04-14: 10 mg via INTRAVENOUS

## 2017-04-14 MED ORDER — ACETAMINOPHEN 325 MG PO TABS
ORAL_TABLET | ORAL | Status: AC
  Filled 2017-04-14: qty 2

## 2017-04-14 MED ORDER — DIPHENHYDRAMINE HCL 50 MG/ML IJ SOLN
INTRAMUSCULAR | Status: AC
  Filled 2017-04-14: qty 1

## 2017-04-14 MED ORDER — DIPHENHYDRAMINE HCL 50 MG/ML IJ SOLN
25.0000 mg | Freq: Once | INTRAMUSCULAR | Status: AC
  Administered 2017-04-14: 25 mg via INTRAVENOUS

## 2017-04-14 MED ORDER — PROCHLORPERAZINE EDISYLATE 5 MG/ML IJ SOLN
INTRAMUSCULAR | Status: AC
  Filled 2017-04-14: qty 2
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
## 2017-04-14 NOTE — Discharge Instructions (Signed)
You were treated in the ER for a migraine headache. Please take over the counter tylenol or advil at home as needed for this. Things to watch out for are sudden worsening, trouble walking, fever. Please call and establish with primary care doctor to follow up as an outpatient
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
## 2017-07-09 ENCOUNTER — Ambulatory Visit (INDEPENDENT_AMBULATORY_CARE_PROVIDER_SITE_OTHER): Payer: Self-pay | Admitting: *Deleted

## 2017-07-09 DIAGNOSIS — I495 Sick sinus syndrome: Secondary | ICD-10-CM

## 2017-07-09 NOTE — Progress Notes (Signed)
Remote pacemaker transmission.   

## 2017-07-11 ENCOUNTER — Encounter: Payer: Self-pay | Admitting: Cardiology

## 2017-07-24 LAB — CUP PACEART REMOTE DEVICE CHECK
Battery Remaining Longevity: 120 mo
Battery Remaining Percentage: 95.5 %
Brady Statistic AP VS Percent: 11 %
Brady Statistic RA Percent Paced: 11 %
Brady Statistic RV Percent Paced: 1 %
Implantable Lead Implant Date: 20070119
Implantable Lead Location: 753860
Lead Channel Impedance Value: 430 Ohm
Lead Channel Pacing Threshold Amplitude: 0.75 V
Lead Channel Pacing Threshold Pulse Width: 0.4 ms
Lead Channel Sensing Intrinsic Amplitude: 4.2 mV
Lead Channel Setting Sensing Sensitivity: 2 mV
MDC IDC LEAD IMPLANT DT: 20070119
MDC IDC LEAD LOCATION: 753859
MDC IDC MSMT BATTERY VOLTAGE: 3.01 V
MDC IDC MSMT LEADCHNL RA PACING THRESHOLD AMPLITUDE: 0.75 V
MDC IDC MSMT LEADCHNL RV IMPEDANCE VALUE: 450 Ohm
MDC IDC MSMT LEADCHNL RV PACING THRESHOLD PULSEWIDTH: 0.4 ms
MDC IDC MSMT LEADCHNL RV SENSING INTR AMPL: 5.1 mV
MDC IDC PG IMPLANT DT: 20171017
MDC IDC PG SERIAL: 7961150
MDC IDC SESS DTM: 20181104002405
MDC IDC SET LEADCHNL RA PACING AMPLITUDE: 2 V
MDC IDC SET LEADCHNL RV PACING AMPLITUDE: 2.5 V
MDC IDC SET LEADCHNL RV PACING PULSEWIDTH: 0.4 ms
MDC IDC STAT BRADY AP VP PERCENT: 1 %
MDC IDC STAT BRADY AS VP PERCENT: 1 %
MDC IDC STAT BRADY AS VS PERCENT: 89 %

## 2017-10-08 ENCOUNTER — Ambulatory Visit (INDEPENDENT_AMBULATORY_CARE_PROVIDER_SITE_OTHER): Payer: Self-pay | Admitting: *Deleted

## 2017-10-08 DIAGNOSIS — I495 Sick sinus syndrome: Secondary | ICD-10-CM

## 2017-10-08 NOTE — Progress Notes (Signed)
Remote pacemaker transmission.   

## 2017-10-09 ENCOUNTER — Encounter: Payer: Self-pay | Admitting: Cardiology

## 2017-10-12 IMAGING — DX DG CHEST 2V
2 series · 2 of 2 positions shown · non-contrast
Comparison: 05/18/2015

CLINICAL DATA: Chest pain, shortness of breath, and hemoptysis for
2 days.

EXAM:
CHEST  2 VIEW

[chest pa]
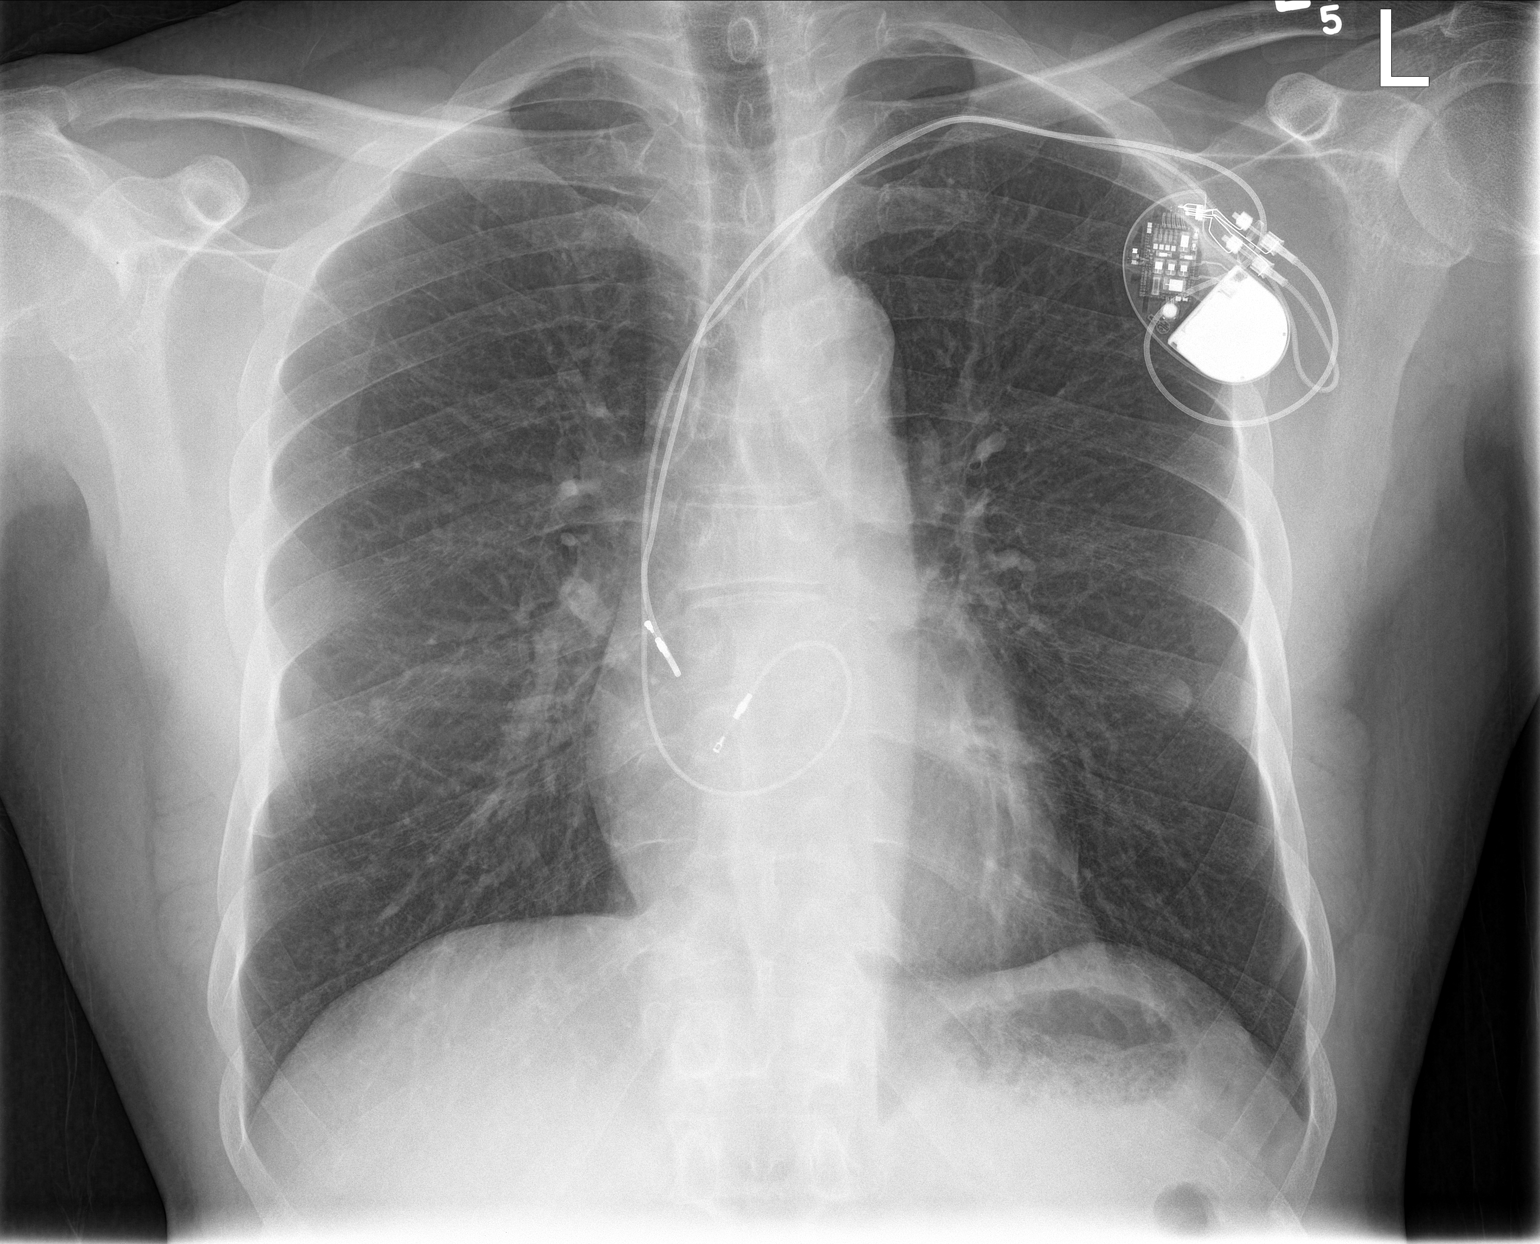

[chest lat]
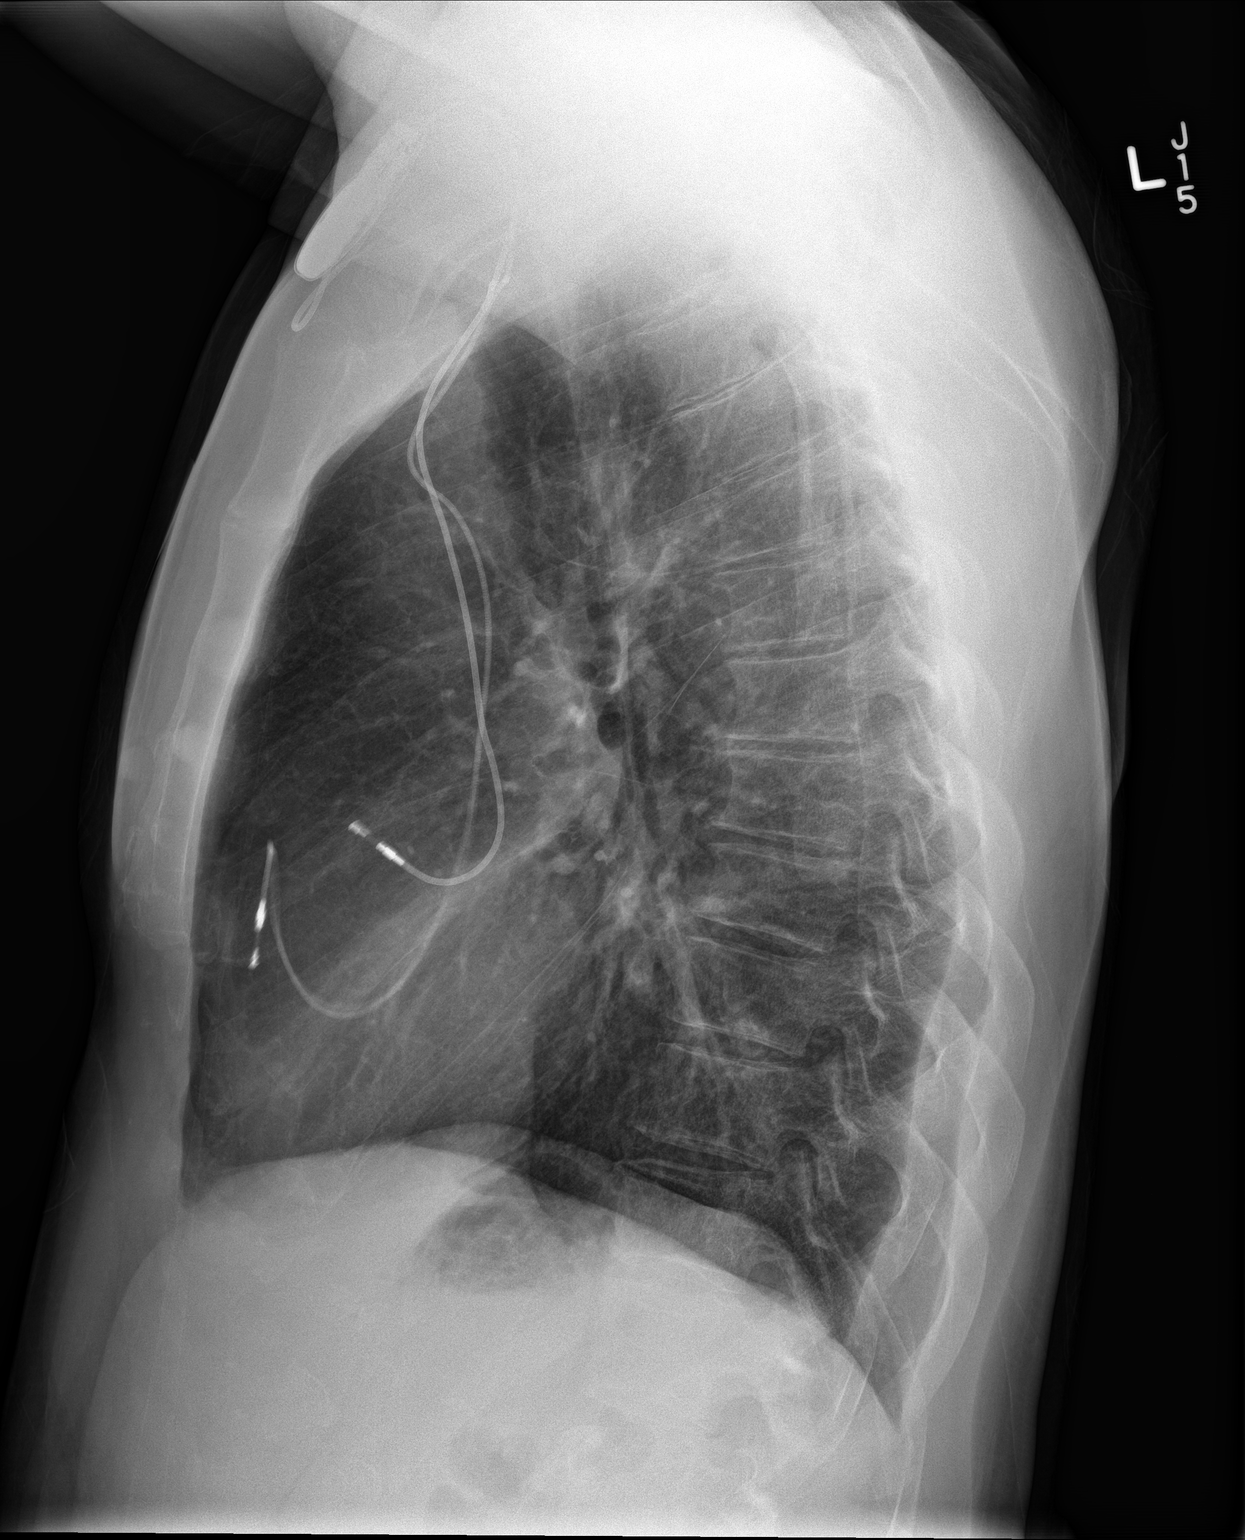

[2 of 2 positions shown; findings below may reference images not displayed]

FINDINGS: Heart size and pulmonary vascularity are normal and the lungs are
clear. No effusions. Pacemaker in place. No osseous abnormality.
IMPRESSION: No active cardiopulmonary disease.

## 2017-10-26 LAB — CUP PACEART REMOTE DEVICE CHECK
Battery Remaining Percentage: 95.5 %
Battery Voltage: 3.01 V
Brady Statistic AP VP Percent: 1 %
Brady Statistic AP VS Percent: 9.3 %
Brady Statistic AS VP Percent: 1 %
Brady Statistic AS VS Percent: 90 %
Date Time Interrogation Session: 20190204101957
Implantable Lead Implant Date: 20070119
Implantable Lead Location: 753859
Implantable Lead Location: 753860
Implantable Pulse Generator Implant Date: 20171017
Lead Channel Impedance Value: 450 Ohm
Lead Channel Pacing Threshold Amplitude: 0.75 V
Lead Channel Pacing Threshold Pulse Width: 0.4 ms
Lead Channel Sensing Intrinsic Amplitude: 4.6 mV
Lead Channel Setting Pacing Amplitude: 2 V
Lead Channel Setting Pacing Amplitude: 2.5 V
Lead Channel Setting Pacing Pulse Width: 0.4 ms
MDC IDC LEAD IMPLANT DT: 20070119
MDC IDC MSMT BATTERY REMAINING LONGEVITY: 117 mo
MDC IDC MSMT LEADCHNL RA IMPEDANCE VALUE: 360 Ohm
MDC IDC MSMT LEADCHNL RA PACING THRESHOLD AMPLITUDE: 0.75 V
MDC IDC MSMT LEADCHNL RA PACING THRESHOLD PULSEWIDTH: 0.4 ms
MDC IDC MSMT LEADCHNL RA SENSING INTR AMPL: 4.2 mV
MDC IDC SET LEADCHNL RV SENSING SENSITIVITY: 2 mV
MDC IDC STAT BRADY RA PERCENT PACED: 9.4 %
MDC IDC STAT BRADY RV PERCENT PACED: 1 %
Pulse Gen Model: 2272
Pulse Gen Serial Number: 7961150

## 2018-01-07 ENCOUNTER — Ambulatory Visit (INDEPENDENT_AMBULATORY_CARE_PROVIDER_SITE_OTHER): Payer: Self-pay | Admitting: *Deleted

## 2018-01-07 DIAGNOSIS — I495 Sick sinus syndrome: Secondary | ICD-10-CM

## 2018-01-07 NOTE — Progress Notes (Signed)
Remote pacemaker transmission.   

## 2018-01-09 ENCOUNTER — Encounter: Payer: Self-pay | Admitting: Cardiology

## 2018-01-21 LAB — CUP PACEART REMOTE DEVICE CHECK
Battery Remaining Percentage: 95.5 %
Battery Voltage: 3.02 V
Brady Statistic AP VS Percent: 8 %
Brady Statistic AS VS Percent: 92 %
Brady Statistic RA Percent Paced: 8.1 %
Date Time Interrogation Session: 20190506060015
Implantable Lead Implant Date: 20070119
Implantable Lead Implant Date: 20070119
Implantable Lead Location: 753859
Implantable Pulse Generator Implant Date: 20171017
Lead Channel Impedance Value: 480 Ohm
Lead Channel Pacing Threshold Amplitude: 0.75 V
Lead Channel Pacing Threshold Pulse Width: 0.4 ms
Lead Channel Pacing Threshold Pulse Width: 0.4 ms
Lead Channel Sensing Intrinsic Amplitude: 5 mV
Lead Channel Setting Pacing Amplitude: 2.5 V
Lead Channel Setting Pacing Pulse Width: 0.4 ms
Lead Channel Setting Sensing Sensitivity: 2 mV
MDC IDC LEAD LOCATION: 753860
MDC IDC MSMT BATTERY REMAINING LONGEVITY: 126 mo
MDC IDC MSMT LEADCHNL RA IMPEDANCE VALUE: 360 Ohm
MDC IDC MSMT LEADCHNL RV PACING THRESHOLD AMPLITUDE: 0.75 V
MDC IDC MSMT LEADCHNL RV SENSING INTR AMPL: 4.7 mV
MDC IDC PG SERIAL: 7961150
MDC IDC SET LEADCHNL RA PACING AMPLITUDE: 2 V
MDC IDC STAT BRADY AP VP PERCENT: 1 %
MDC IDC STAT BRADY AS VP PERCENT: 1 %
MDC IDC STAT BRADY RV PERCENT PACED: 1 %

## 2018-04-08 ENCOUNTER — Ambulatory Visit (INDEPENDENT_AMBULATORY_CARE_PROVIDER_SITE_OTHER): Payer: Self-pay | Admitting: *Deleted

## 2018-04-08 DIAGNOSIS — I495 Sick sinus syndrome: Secondary | ICD-10-CM

## 2018-04-08 NOTE — Progress Notes (Signed)
Remote pacemaker transmission.   

## 2018-04-29 ENCOUNTER — Encounter: Payer: Self-pay | Admitting: Internal Medicine

## 2018-04-30 ENCOUNTER — Encounter: Payer: Self-pay | Admitting: Internal Medicine

## 2018-05-02 LAB — CUP PACEART REMOTE DEVICE CHECK
Battery Remaining Longevity: 127 mo
Battery Remaining Percentage: 95.5 %
Brady Statistic AP VP Percent: 1 %
Brady Statistic AP VS Percent: 7.4 %
Brady Statistic RA Percent Paced: 7.4 %
Brady Statistic RV Percent Paced: 1 %
Date Time Interrogation Session: 20190805060019
Implantable Lead Implant Date: 20070119
Implantable Lead Location: 753860
Lead Channel Impedance Value: 410 Ohm
Lead Channel Pacing Threshold Amplitude: 0.75 V
Lead Channel Pacing Threshold Amplitude: 0.75 V
Lead Channel Pacing Threshold Pulse Width: 0.4 ms
Lead Channel Sensing Intrinsic Amplitude: 2 mV
Lead Channel Setting Sensing Sensitivity: 2 mV
MDC IDC LEAD IMPLANT DT: 20070119
MDC IDC LEAD LOCATION: 753859
MDC IDC MSMT BATTERY VOLTAGE: 3.01 V
MDC IDC MSMT LEADCHNL RV IMPEDANCE VALUE: 480 Ohm
MDC IDC MSMT LEADCHNL RV PACING THRESHOLD PULSEWIDTH: 0.4 ms
MDC IDC MSMT LEADCHNL RV SENSING INTR AMPL: 4.3 mV
MDC IDC PG IMPLANT DT: 20171017
MDC IDC PG SERIAL: 7961150
MDC IDC SET LEADCHNL RA PACING AMPLITUDE: 2 V
MDC IDC SET LEADCHNL RV PACING AMPLITUDE: 2.5 V
MDC IDC SET LEADCHNL RV PACING PULSEWIDTH: 0.4 ms
MDC IDC STAT BRADY AS VP PERCENT: 1 %
MDC IDC STAT BRADY AS VS PERCENT: 92 %
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
## 2018-06-26 ENCOUNTER — Ambulatory Visit (INDEPENDENT_AMBULATORY_CARE_PROVIDER_SITE_OTHER): Payer: Self-pay | Admitting: Nurse Practitioner

## 2018-06-26 ENCOUNTER — Encounter: Payer: Self-pay | Admitting: *Deleted

## 2018-06-26 ENCOUNTER — Encounter: Payer: Self-pay | Admitting: Nurse Practitioner

## 2018-06-26 VITALS — BP 122/78 | HR 86 | Ht 70.5 in | Wt 181.0 lb

## 2018-06-26 DIAGNOSIS — F329 Major depressive disorder, single episode, unspecified: Secondary | ICD-10-CM

## 2018-06-26 DIAGNOSIS — R079 Chest pain, unspecified: Secondary | ICD-10-CM

## 2018-06-26 DIAGNOSIS — I495 Sick sinus syndrome: Secondary | ICD-10-CM

## 2018-06-26 DIAGNOSIS — F32A Depression, unspecified: Secondary | ICD-10-CM

## 2018-06-26 DIAGNOSIS — R0602 Shortness of breath: Secondary | ICD-10-CM

## 2018-06-26 DIAGNOSIS — R5383 Other fatigue: Secondary | ICD-10-CM

## 2018-06-26 LAB — CUP PACEART INCLINIC DEVICE CHECK
Date Time Interrogation Session: 20191023105826
Implantable Lead Implant Date: 20070119
Implantable Lead Implant Date: 20070119
Implantable Lead Location: 753860
MDC IDC LEAD LOCATION: 753859
MDC IDC PG IMPLANT DT: 20171017
MDC IDC PG SERIAL: 7961150

## 2018-06-26 NOTE — Patient Instructions (Addendum)
Medication Instructions:  Your physician recommends that you continue on your current medications as directed. Please refer to the Current Medication list given to you today.  If you need a refill on your cardiac medications before your next appointment, please call your pharmacy.  Labwork:  BMET CBC TSH    Testing/Procedures: Your physician has requested that you have an echocardiogram. Echocardiography is a painless test that uses sound waves to create images of your heart. It provides your doctor with information about the size and shape of your heart and how well your heart's chambers and valves are working. This procedure takes approximately one hour. There are no restrictions for this procedure.  Your physician has requested that you have a lexiscan myoview. For further information please visit [email or web address] Please follow instruction sheet, as given.     Follow-Up: Your physician wants you to follow-up in: ONE YEAR WITH  ALLRED   You will receive a reminder letter in the mail two months in advance. If you don't receive a letter, please call our office to schedule the follow-up appointment.  Remote monitoring is used to monitor your Pacemaker of ICD from home. This monitoring reduces the number of office visits required to check your device to one time per year. It allows [place] to keep an eye on the functioning of your device to ensure it is working properly. You are scheduled for a device check from home on . 07-08-18 You may send your transmission at any time that day. If you have a wireless device, the transmission will be sent automatically. After your physician reviews your transmission, you will receive a postcard with your next transmission date.     Any Other Special Instructions Will Be Listed Below (If Applicable).

## 2018-06-27 ENCOUNTER — Telehealth: Payer: Self-pay

## 2018-06-27 LAB — CBC
Hematocrit: 42.7 % (ref 37.5–51.0)
Hemoglobin: 14.5 g/dL (ref 13.0–17.7)
MCH: 33.3 pg — AB (ref 26.6–33.0)
MCHC: 34 g/dL (ref 31.5–35.7)
MCV: 98 fL — AB (ref 79–97)
PLATELETS: 181 10*3/uL (ref 150–450)
RBC: 4.35 x10E6/uL (ref 4.14–5.80)
RDW: 12.6 % (ref 12.3–15.4)
WBC: 10.5 10*3/uL (ref 3.4–10.8)

## 2018-06-27 LAB — BASIC METABOLIC PANEL
BUN / CREAT RATIO: 11 (ref 9–20)
BUN: 11 mg/dL (ref 6–24)
CO2: 26 mmol/L (ref 20–29)
CREATININE: 0.99 mg/dL (ref 0.76–1.27)
Calcium: 9.6 mg/dL (ref 8.7–10.2)
Chloride: 99 mmol/L (ref 96–106)
GFR calc Af Amer: 101 mL/min/{1.73_m2} (ref >59)
GFR calc non Af Amer: 87 mL/min/{1.73_m2} (ref >59)
GLUCOSE: 93 mg/dL (ref 65–99)
POTASSIUM: 4.5 mmol/L (ref 3.5–5.2)
Sodium: 139 mmol/L (ref 134–144)

## 2018-06-27 LAB — TSH: TSH: 0.828 u[IU]/mL (ref 0.450–4.500)

## 2018-06-27 NOTE — Telephone Encounter (Signed)
Notes recorded by Sigurd Sos, RN on 06/27/2018 at 12:05 PM EDT The patient has been notified of the result and verbalized understanding. All questions (if any) were answered. Sigurd Sos, RN 06/27/2018 12:05 PM

## 2018-06-27 NOTE — Telephone Encounter (Signed)
-----   Message from Marily Lente, NP sent at 06/27/2018  8:49 AM EDT ----- All values are normal or within acceptable limits.   Medication changes / Follow up labs / Other changes or recommendations:   none Gypsy Balsam, NP 06/27/2018 8:49 AM

## 2018-07-08 ENCOUNTER — Ambulatory Visit (INDEPENDENT_AMBULATORY_CARE_PROVIDER_SITE_OTHER): Payer: Self-pay | Admitting: *Deleted

## 2018-07-08 DIAGNOSIS — I495 Sick sinus syndrome: Secondary | ICD-10-CM

## 2018-07-08 NOTE — Progress Notes (Signed)
Remote pacemaker transmission.   

## 2018-07-09 ENCOUNTER — Telehealth (HOSPITAL_COMMUNITY): Payer: Self-pay

## 2018-07-11 ENCOUNTER — Encounter: Payer: Self-pay | Admitting: Cardiology

## 2018-07-11 ENCOUNTER — Encounter (HOSPITAL_COMMUNITY): Payer: Self-pay

## 2018-07-11 ENCOUNTER — Other Ambulatory Visit (HOSPITAL_COMMUNITY): Payer: Self-pay

## 2018-07-23 NOTE — Telephone Encounter (Signed)
Attempted to contact the pt. Line was busy and we will try again later. S.Odetta Forness EMTP

## 2018-07-25 ENCOUNTER — Encounter (HOSPITAL_COMMUNITY): Payer: Self-pay | Admitting: *Deleted

## 2018-07-25 ENCOUNTER — Ambulatory Visit (HOSPITAL_COMMUNITY): Payer: Self-pay | Attending: Cardiology

## 2018-07-25 ENCOUNTER — Other Ambulatory Visit: Payer: Self-pay

## 2018-07-25 ENCOUNTER — Ambulatory Visit (HOSPITAL_BASED_OUTPATIENT_CLINIC_OR_DEPARTMENT_OTHER): Payer: Self-pay

## 2018-07-25 DIAGNOSIS — R079 Chest pain, unspecified: Secondary | ICD-10-CM | POA: Insufficient documentation

## 2018-07-25 DIAGNOSIS — R0602 Shortness of breath: Secondary | ICD-10-CM | POA: Insufficient documentation

## 2018-07-25 DIAGNOSIS — R5383 Other fatigue: Secondary | ICD-10-CM | POA: Insufficient documentation

## 2018-07-25 LAB — ECHOCARDIOGRAM COMPLETE
Height: 70.5 in
Weight: 2896 [oz_av]

## 2018-07-25 MED ORDER — TECHNETIUM TC 99M TETROFOSMIN IV KIT
32.4000 | PACK | Freq: Once | INTRAVENOUS | Status: AC | PRN
  Administered 2018-07-25: 32.4 via INTRAVENOUS
  Filled 2018-07-25: qty 33

## 2018-07-29 ENCOUNTER — Ambulatory Visit (HOSPITAL_COMMUNITY): Payer: Self-pay | Attending: Internal Medicine

## 2018-07-29 ENCOUNTER — Telehealth: Payer: Self-pay

## 2018-07-29 DIAGNOSIS — R079 Chest pain, unspecified: Secondary | ICD-10-CM | POA: Insufficient documentation

## 2018-07-29 DIAGNOSIS — R0602 Shortness of breath: Secondary | ICD-10-CM | POA: Insufficient documentation

## 2018-07-29 MED ORDER — REGADENOSON 0.4 MG/5ML IV SOLN
0.4000 mg | Freq: Once | INTRAVENOUS | Status: AC
  Administered 2018-07-29: 0.4 mg via INTRAVENOUS

## 2018-07-29 MED ORDER — TECHNETIUM TC 99M TETROFOSMIN IV KIT
32.2000 | PACK | Freq: Once | INTRAVENOUS | Status: AC | PRN
  Administered 2018-07-29: 32.2 via INTRAVENOUS
  Filled 2018-07-29: qty 33

## 2018-07-29 NOTE — Telephone Encounter (Signed)
-----   Message from Amber K Seiler, NP sent at 07/29/2018  7:45 AM EST ----- Please notify patient of echo results. Normal EF, no valvular abnormalities. Thanks! Amber 

## 2018-07-29 NOTE — Telephone Encounter (Signed)
Notes recorded by Sigurd Sosapp, Shamiyah Ngu, RN on 07/29/2018 at 4:53 PM EST The patient has been notified of the result and verbalized understanding. All questions (if any) were answered. Sigurd SosMichael Keonta Alsip, RN 07/29/2018 4:53 PM

## 2018-07-29 NOTE — Telephone Encounter (Signed)
Notes recorded by Sigurd Sosapp, Yamilett Anastos, RN on 07/29/2018 at 4:31 PM EST [email or web address] 11/25 ------

## 2018-07-29 NOTE — Telephone Encounter (Signed)
Notes recorded by Sigurd Sosapp, Randolf Sansoucie, RN on 07/29/2018 at 9:34 AM EST [email or web address] 11/25 ------

## 2018-07-29 NOTE — Telephone Encounter (Signed)
-----   Message from Marily LenteAmber K Seiler, NP sent at 07/29/2018  7:45 AM EST ----- Please notify patient of echo results. Normal EF, no valvular abnormalities. Thanks! Hospital doctorAmber

## 2018-07-30 ENCOUNTER — Telehealth: Payer: Self-pay

## 2018-07-30 LAB — MYOCARDIAL PERFUSION IMAGING
CHL CUP NUCLEAR SDS: 0
CHL CUP RESTING HR STRESS: 67 {beats}/min
CSEPPHR: 93 {beats}/min
LV sys vol: 40 mL
LVDIAVOL: 97 mL (ref 62–150)
NUC STRESS TID: 1.12
SRS: 0
SSS: 0

## 2018-07-30 NOTE — Telephone Encounter (Signed)
-----   Message from Marily LenteAmber K Seiler, NP sent at 07/30/2018  8:09 AM EST ----- Please notify patient of normal myoview. Thanks! Hospital doctorAmber

## 2018-07-30 NOTE — Telephone Encounter (Signed)
Notes recorded by Sigurd Sosapp, Dwan Hemmelgarn, RN on 07/30/2018 at 8:35 AM EST Left patient a message letting him know that his stress test results were normal and that if he had questions, he should call us. ------

## 2018-09-06 LAB — CUP PACEART REMOTE DEVICE CHECK
Battery Remaining Longevity: 125 mo
Battery Remaining Percentage: 95.5 %
Brady Statistic AP VP Percent: 1 %
Brady Statistic AP VS Percent: 1.3 %
Brady Statistic AS VS Percent: 99 %
Implantable Lead Implant Date: 20070119
Implantable Lead Implant Date: 20070119
Implantable Lead Location: 753859
Implantable Lead Location: 753860
Implantable Pulse Generator Implant Date: 20171017
Lead Channel Impedance Value: 380 Ohm
Lead Channel Impedance Value: 480 Ohm
Lead Channel Pacing Threshold Amplitude: 0.75 V
Lead Channel Pacing Threshold Pulse Width: 0.4 ms
Lead Channel Sensing Intrinsic Amplitude: 4.4 mV
Lead Channel Setting Pacing Amplitude: 2.5 V
Lead Channel Setting Pacing Pulse Width: 0.4 ms
MDC IDC MSMT BATTERY VOLTAGE: 3.01 V
MDC IDC MSMT LEADCHNL RA PACING THRESHOLD AMPLITUDE: 0.5 V
MDC IDC MSMT LEADCHNL RA PACING THRESHOLD PULSEWIDTH: 0.4 ms
MDC IDC MSMT LEADCHNL RV SENSING INTR AMPL: 4.2 mV
MDC IDC SESS DTM: 20191104060022
MDC IDC SET LEADCHNL RA PACING AMPLITUDE: 2 V
MDC IDC SET LEADCHNL RV SENSING SENSITIVITY: 2 mV
MDC IDC STAT BRADY AS VP PERCENT: 1 %
MDC IDC STAT BRADY RA PERCENT PACED: 1.3 %
MDC IDC STAT BRADY RV PERCENT PACED: 1 %
Pulse Gen Serial Number: 7961150

## 2018-10-07 ENCOUNTER — Ambulatory Visit (INDEPENDENT_AMBULATORY_CARE_PROVIDER_SITE_OTHER): Payer: Self-pay

## 2018-10-07 DIAGNOSIS — I495 Sick sinus syndrome: Secondary | ICD-10-CM

## 2018-10-09 LAB — CUP PACEART REMOTE DEVICE CHECK
Battery Remaining Longevity: 125 mo
Brady Statistic AP VP Percent: 1 %
Brady Statistic AP VS Percent: 2.1 %
Brady Statistic AS VP Percent: 1 %
Brady Statistic AS VS Percent: 98 %
Brady Statistic RV Percent Paced: 1 %
Date Time Interrogation Session: 20200203070013
Implantable Lead Implant Date: 20070119
Implantable Lead Location: 753860
Lead Channel Impedance Value: 360 Ohm
Lead Channel Impedance Value: 480 Ohm
Lead Channel Pacing Threshold Amplitude: 0.75 V
Lead Channel Pacing Threshold Pulse Width: 0.4 ms
Lead Channel Sensing Intrinsic Amplitude: 4 mV
Lead Channel Sensing Intrinsic Amplitude: 4.3 mV
Lead Channel Setting Pacing Amplitude: 2 V
Lead Channel Setting Pacing Amplitude: 2.5 V
Lead Channel Setting Pacing Pulse Width: 0.4 ms
Lead Channel Setting Sensing Sensitivity: 2 mV
MDC IDC LEAD IMPLANT DT: 20070119
MDC IDC LEAD LOCATION: 753859
MDC IDC MSMT BATTERY REMAINING PERCENTAGE: 95.5 %
MDC IDC MSMT BATTERY VOLTAGE: 3.01 V
MDC IDC MSMT LEADCHNL RA PACING THRESHOLD AMPLITUDE: 0.5 V
MDC IDC MSMT LEADCHNL RA PACING THRESHOLD PULSEWIDTH: 0.4 ms
MDC IDC PG IMPLANT DT: 20171017
MDC IDC PG SERIAL: 7961150
MDC IDC STAT BRADY RA PERCENT PACED: 2.2 %

## 2018-10-15 NOTE — Progress Notes (Signed)
Remote pacemaker transmission.   

## 2019-01-13 ENCOUNTER — Other Ambulatory Visit: Payer: Self-pay

## 2019-01-13 ENCOUNTER — Ambulatory Visit (INDEPENDENT_AMBULATORY_CARE_PROVIDER_SITE_OTHER): Payer: Self-pay | Admitting: *Deleted

## 2019-01-13 DIAGNOSIS — I495 Sick sinus syndrome: Secondary | ICD-10-CM

## 2019-01-14 LAB — CUP PACEART REMOTE DEVICE CHECK
Date Time Interrogation Session: 20200512110540
Implantable Lead Implant Date: 20070119
Implantable Lead Implant Date: 20070119
Implantable Lead Location: 753859
Implantable Lead Location: 753860
Implantable Pulse Generator Implant Date: 20171017
Pulse Gen Model: 2272
Pulse Gen Serial Number: 7961150

## 2019-01-20 NOTE — Progress Notes (Signed)
Remote pacemaker transmission.   

## 2019-04-14 ENCOUNTER — Encounter: Payer: Self-pay | Admitting: *Deleted

## 2019-05-01 ENCOUNTER — Ambulatory Visit (INDEPENDENT_AMBULATORY_CARE_PROVIDER_SITE_OTHER): Payer: Self-pay | Admitting: *Deleted

## 2019-05-01 DIAGNOSIS — R55 Syncope and collapse: Secondary | ICD-10-CM

## 2019-05-01 LAB — CUP PACEART REMOTE DEVICE CHECK
Battery Remaining Longevity: 126 mo
Battery Remaining Percentage: 95.5 %
Battery Voltage: 3.01 V
Brady Statistic AP VP Percent: 1.1 %
Brady Statistic AP VS Percent: 9 %
Brady Statistic AS VP Percent: 1 %
Brady Statistic AS VS Percent: 90 %
Brady Statistic RA Percent Paced: 10 %
Brady Statistic RV Percent Paced: 1.4 %
Date Time Interrogation Session: 20200827060015
Implantable Lead Implant Date: 20070119
Implantable Lead Implant Date: 20070119
Implantable Lead Location: 753859
Implantable Lead Location: 753860
Implantable Pulse Generator Implant Date: 20171017
Lead Channel Impedance Value: 440 Ohm
Lead Channel Impedance Value: 480 Ohm
Lead Channel Pacing Threshold Amplitude: 0.5 V
Lead Channel Pacing Threshold Amplitude: 0.75 V
Lead Channel Pacing Threshold Pulse Width: 0.4 ms
Lead Channel Pacing Threshold Pulse Width: 0.4 ms
Lead Channel Sensing Intrinsic Amplitude: 4.5 mV
Lead Channel Sensing Intrinsic Amplitude: 5 mV
Lead Channel Setting Pacing Amplitude: 2 V
Lead Channel Setting Pacing Amplitude: 2.5 V
Lead Channel Setting Pacing Pulse Width: 0.4 ms
Lead Channel Setting Sensing Sensitivity: 2 mV
Pulse Gen Model: 2272
Pulse Gen Serial Number: 7961150

## 2019-05-02 ENCOUNTER — Other Ambulatory Visit: Payer: Self-pay

## 2019-05-06 NOTE — Progress Notes (Signed)
Remote pacemaker transmission.   

## 2019-06-23 ENCOUNTER — Other Ambulatory Visit: Payer: Self-pay

## 2019-06-23 ENCOUNTER — Telehealth (INDEPENDENT_AMBULATORY_CARE_PROVIDER_SITE_OTHER): Payer: Self-pay | Admitting: Internal Medicine

## 2019-06-23 ENCOUNTER — Encounter: Payer: Self-pay | Admitting: Internal Medicine

## 2019-06-23 DIAGNOSIS — I495 Sick sinus syndrome: Secondary | ICD-10-CM

## 2019-06-23 NOTE — Progress Notes (Signed)
Unable to reach patient by phone.   Will need to reschedule for another day once the patient can be reached.

## 2019-07-02 ENCOUNTER — Other Ambulatory Visit: Payer: Self-pay

## 2019-07-02 ENCOUNTER — Telehealth (INDEPENDENT_AMBULATORY_CARE_PROVIDER_SITE_OTHER): Payer: Self-pay | Admitting: Internal Medicine

## 2019-07-02 DIAGNOSIS — I495 Sick sinus syndrome: Secondary | ICD-10-CM
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
## 2019-08-04 ENCOUNTER — Ambulatory Visit (INDEPENDENT_AMBULATORY_CARE_PROVIDER_SITE_OTHER): Payer: Self-pay | Admitting: *Deleted

## 2019-08-04 DIAGNOSIS — I495 Sick sinus syndrome: Secondary | ICD-10-CM

## 2019-08-05 LAB — CUP PACEART REMOTE DEVICE CHECK
Battery Remaining Longevity: 124 mo
Battery Remaining Percentage: 95.5 %
Battery Voltage: 3.01 V
Brady Statistic AP VP Percent: 1 %
Brady Statistic AP VS Percent: 7.7 %
Brady Statistic AS VP Percent: 1 %
Brady Statistic AS VS Percent: 91 %
Brady Statistic RA Percent Paced: 8.3 %
Brady Statistic RV Percent Paced: 1.1 %
Date Time Interrogation Session: 20201130182142
Implantable Lead Implant Date: 20070119
Implantable Lead Implant Date: 20070119
Implantable Lead Location: 753859
Implantable Lead Location: 753860
Implantable Pulse Generator Implant Date: 20171017
Lead Channel Impedance Value: 360 Ohm
Lead Channel Impedance Value: 460 Ohm
Lead Channel Pacing Threshold Amplitude: 0.5 V
Lead Channel Pacing Threshold Amplitude: 0.75 V
Lead Channel Pacing Threshold Pulse Width: 0.4 ms
Lead Channel Pacing Threshold Pulse Width: 0.4 ms
Lead Channel Sensing Intrinsic Amplitude: 2.1 mV
Lead Channel Sensing Intrinsic Amplitude: 5 mV
Lead Channel Setting Pacing Amplitude: 2 V
Lead Channel Setting Pacing Amplitude: 2.5 V
Lead Channel Setting Pacing Pulse Width: 0.4 ms
Lead Channel Setting Sensing Sensitivity: 2 mV
Pulse Gen Model: 2272
Pulse Gen Serial Number: 7961150

## 2019-11-03 ENCOUNTER — Ambulatory Visit (INDEPENDENT_AMBULATORY_CARE_PROVIDER_SITE_OTHER): Payer: Self-pay | Admitting: *Deleted

## 2019-11-03 DIAGNOSIS — I495 Sick sinus syndrome: Secondary | ICD-10-CM

## 2019-11-03 LAB — CUP PACEART REMOTE DEVICE CHECK
Battery Remaining Longevity: 124 mo
Battery Remaining Percentage: 95.5 %
Battery Voltage: 3.01 V
Brady Statistic AP VP Percent: 1 %
Brady Statistic AP VS Percent: 9.6 %
Brady Statistic AS VP Percent: 1 %
Brady Statistic AS VS Percent: 89 %
Brady Statistic RA Percent Paced: 10 %
Brady Statistic RV Percent Paced: 1.2 %
Date Time Interrogation Session: 20210301030616
Implantable Lead Implant Date: 20070119
Implantable Lead Implant Date: 20070119
Implantable Lead Location: 753859
Implantable Lead Location: 753860
Implantable Pulse Generator Implant Date: 20171017
Lead Channel Impedance Value: 360 Ohm
Lead Channel Impedance Value: 440 Ohm
Lead Channel Pacing Threshold Amplitude: 0.5 V
Lead Channel Pacing Threshold Amplitude: 0.75 V
Lead Channel Pacing Threshold Pulse Width: 0.4 ms
Lead Channel Pacing Threshold Pulse Width: 0.4 ms
Lead Channel Sensing Intrinsic Amplitude: 4.1 mV
Lead Channel Sensing Intrinsic Amplitude: 4.2 mV
Lead Channel Setting Pacing Amplitude: 2 V
Lead Channel Setting Pacing Amplitude: 2.5 V
Lead Channel Setting Pacing Pulse Width: 0.4 ms
Lead Channel Setting Sensing Sensitivity: 2 mV
Pulse Gen Model: 2272
Pulse Gen Serial Number: 7961150

## 2019-11-03 NOTE — Progress Notes (Signed)
PPM Remote  

## 2019-11-27 ENCOUNTER — Ambulatory Visit: Payer: Self-pay | Attending: Internal Medicine

## 2019-11-27 DIAGNOSIS — Z23 Encounter for immunization: Secondary | ICD-10-CM

## 2019-11-27 NOTE — Progress Notes (Signed)
   Covid-19 Vaccination Clinic  Name:  Harry Zhang    MRN: [id number] DOB: 1966-02-21  11/27/2019  [name] was observed post Covid-19 immunization for 15 minutes without incident. He was provided with Vaccine Information Sheet and instruction to access the V-Safe system.   [name] was instructed to call 911 with any severe reactions post vaccine: Marland Kitchen Difficulty breathing  . Swelling of face and throat  . A fast heartbeat  . A bad rash all over body  . Dizziness and weakness   Immunizations Administered    Name Date Dose VIS Date Route   Moderna COVID-19 Vaccine 11/27/2019 10:56 AM 0.5 mL 08/05/2019 Intramuscular   Manufacturer: Moderna   Lot: [id number]   NDC: 37543-606-77

## 2019-12-30 ENCOUNTER — Ambulatory Visit: Payer: Self-pay | Attending: Internal Medicine

## 2019-12-30 DIAGNOSIS — Z23 Encounter for immunization: Secondary | ICD-10-CM

## 2019-12-30 NOTE — Progress Notes (Signed)
   Covid-19 Vaccination Clinic  Name:  AHMAN DUGDALE    MRN: [id number] DOB: [birth year]  12/30/2019  Mr. Delker was observed post Covid-19 immunization for 15 minutes without incident. He was provided with Vaccine Information Sheet and instruction to access the V-Safe system.   Mr. Alperin was instructed to call 911 with any severe reactions post vaccine: Marland Kitchen Difficulty breathing  . Swelling of face and throat  . A fast heartbeat  . A bad rash all over body  . Dizziness and weakness   Immunizations Administered    Name Date Dose VIS Date Route   Moderna COVID-19 Vaccine 12/30/2019  8:09 AM 0.5 mL 08/2019 Intramuscular   Manufacturer: Moderna   Lot: [id number]   NDC: 82800-349-17

## 2020-02-19 ENCOUNTER — Ambulatory Visit (INDEPENDENT_AMBULATORY_CARE_PROVIDER_SITE_OTHER): Payer: Self-pay | Admitting: *Deleted

## 2020-02-19 DIAGNOSIS — R55 Syncope and collapse: Secondary | ICD-10-CM

## 2020-02-19 LAB — CUP PACEART REMOTE DEVICE CHECK
Battery Remaining Longevity: 124 mo
Battery Remaining Percentage: 95.5 %
Battery Voltage: 3.01 V
Brady Statistic AP VP Percent: 1 %
Brady Statistic AP VS Percent: 8.7 %
Brady Statistic AS VP Percent: 1 %
Brady Statistic AS VS Percent: 90 %
Brady Statistic RA Percent Paced: 9.3 %
Brady Statistic RV Percent Paced: 1 %
Date Time Interrogation Session: 20210616205332
Implantable Lead Implant Date: 20070119
Implantable Lead Implant Date: 20070119
Implantable Lead Location: 753859
Implantable Lead Location: 753860
Implantable Pulse Generator Implant Date: 20171017
Lead Channel Impedance Value: 380 Ohm
Lead Channel Impedance Value: 410 Ohm
Lead Channel Pacing Threshold Amplitude: 0.5 V
Lead Channel Pacing Threshold Amplitude: 0.75 V
Lead Channel Pacing Threshold Pulse Width: 0.4 ms
Lead Channel Pacing Threshold Pulse Width: 0.4 ms
Lead Channel Sensing Intrinsic Amplitude: 2.7 mV
Lead Channel Sensing Intrinsic Amplitude: 4.1 mV
Lead Channel Setting Pacing Amplitude: 2 V
Lead Channel Setting Pacing Amplitude: 2.5 V
Lead Channel Setting Pacing Pulse Width: 0.4 ms
Lead Channel Setting Sensing Sensitivity: 2 mV
Pulse Gen Model: 2272
Pulse Gen Serial Number: 7961150

## 2020-02-20 NOTE — Progress Notes (Signed)
Remote pacemaker transmission.   

## 2020-04-28 ENCOUNTER — Encounter (HOSPITAL_COMMUNITY): Payer: Self-pay

## 2020-04-28 ENCOUNTER — Emergency Department (HOSPITAL_COMMUNITY)
Admission: EM | Admit: 2020-04-28 | Discharge: 2020-04-28 | Disposition: A | Discharge status: Home / Self Care | Payer: HRSA Program | Attending: Emergency Medicine | Admitting: Emergency Medicine

## 2020-04-28 ENCOUNTER — Other Ambulatory Visit: Payer: Self-pay

## 2020-04-28 DIAGNOSIS — Z87891 Personal history of nicotine dependence: Secondary | ICD-10-CM | POA: Insufficient documentation

## 2020-04-28 DIAGNOSIS — J069 Acute upper respiratory infection, unspecified: Secondary | ICD-10-CM | POA: Diagnosis not present

## 2020-04-28 DIAGNOSIS — F259 Schizoaffective disorder, unspecified: Secondary | ICD-10-CM | POA: Insufficient documentation

## 2020-04-28 DIAGNOSIS — Z20822 Contact with and (suspected) exposure to covid-19: Secondary | ICD-10-CM | POA: Insufficient documentation

## 2020-04-28 DIAGNOSIS — R110 Nausea: Secondary | ICD-10-CM | POA: Insufficient documentation

## 2020-04-28 DIAGNOSIS — I2510 Atherosclerotic heart disease of native coronary artery without angina pectoris: Secondary | ICD-10-CM | POA: Diagnosis not present

## 2020-04-28 LAB — COMPREHENSIVE METABOLIC PANEL
ALT: 26 U/L (ref 0–44)
AST: 32 U/L (ref 15–41)
Albumin: 3.8 g/dL (ref 3.5–5.0)
Alkaline Phosphatase: 92 U/L (ref 38–126)
Anion gap: 5 (ref 5–15)
BUN: 12 mg/dL (ref 6–20)
CO2: 30 mmol/L (ref 22–32)
Calcium: 9 mg/dL (ref 8.9–10.3)
Chloride: 101 mmol/L (ref 98–111)
Creatinine, Ser: 0.84 mg/dL (ref 0.61–1.24)
GFR calc Af Amer: >60 mL/min (ref >60)
GFR calc non Af Amer: >60 mL/min (ref >60)
Glucose, Bld: 89 mg/dL (ref 70–99)
Potassium: 4.7 mmol/L (ref 3.5–5.1)
Sodium: 136 mmol/L (ref 135–145)
Total Bilirubin: 0.6 mg/dL (ref 0.3–1.2)
Total Protein: 6.6 g/dL (ref 6.5–8.1)

## 2020-04-28 LAB — CBC WITH DIFFERENTIAL/PLATELET
Abs Immature Granulocytes: 0.01 10*3/uL (ref 0.00–0.07)
Basophils Absolute: 0 10*3/uL (ref 0.0–0.1)
Basophils Relative: 1 %
Eosinophils Absolute: 0.3 10*3/uL (ref 0.0–0.5)
Eosinophils Relative: 5 %
HCT: 42.1 % (ref 39.0–52.0)
Hemoglobin: 13.7 g/dL (ref 13.0–17.0)
Immature Granulocytes: 0 %
Lymphocytes Relative: 24 %
Lymphs Abs: 1.5 10*3/uL (ref 0.7–4.0)
MCH: 34.4 pg — ABNORMAL HIGH (ref 26.0–34.0)
MCHC: 32.5 g/dL (ref 30.0–36.0)
MCV: 105.8 fL — ABNORMAL HIGH (ref 80.0–100.0)
Monocytes Absolute: 0.7 10*3/uL (ref 0.1–1.0)
Monocytes Relative: 12 %
Neutro Abs: 3.5 10*3/uL (ref 1.7–7.7)
Neutrophils Relative %: 58 %
Platelets: 175 10*3/uL (ref 150–400)
RBC: 3.98 MIL/uL — ABNORMAL LOW (ref 4.22–5.81)
RDW: 14.2 % (ref 11.5–15.5)
WBC: 6.1 10*3/uL (ref 4.0–10.5)
nRBC: 0 % (ref 0.0–0.2)

## 2020-04-28 LAB — SARS CORONAVIRUS 2 BY RT PCR (HOSPITAL ORDER, PERFORMED IN ~~LOC~~ HOSPITAL LAB): SARS Coronavirus 2: NEGATIVE
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
## 2020-04-28 NOTE — Discharge Instructions (Addendum)
Your work-up was reassuring today, I want you to follow-up with a primary care in the next couple of days if you do not have one you can use the information provided or go to [facility] wellness.  I want you to get rested and drink plenty of water.  As you spoke about try and stick to a bland diet and drink ginger ale if you feel like that helps you.  Please come back to the emergency department for any new worsening concerning symptoms, if you cannot keep any foods down.

## 2020-04-28 NOTE — ED Triage Notes (Signed)
Pt reports has been working in [place] last week.  Reports nausea x 1 week, cough, fever, and runny nose since yesterday.  Reports has had covid vaccine.

## 2020-04-28 NOTE — ED Notes (Signed)
ekg shown to edp.  PA in with pt.

## 2020-05-20 ENCOUNTER — Ambulatory Visit (INDEPENDENT_AMBULATORY_CARE_PROVIDER_SITE_OTHER): Payer: Self-pay | Admitting: *Deleted

## 2020-05-20 DIAGNOSIS — I495 Sick sinus syndrome: Secondary | ICD-10-CM

## 2020-05-20 LAB — CUP PACEART REMOTE DEVICE CHECK
Battery Remaining Longevity: 125 mo
Battery Remaining Percentage: 95.5 %
Battery Voltage: 3.01 V
Brady Statistic AP VP Percent: 1 %
Brady Statistic AP VS Percent: 8.1 %
Brady Statistic AS VP Percent: 1 %
Brady Statistic AS VS Percent: 91 %
Brady Statistic RA Percent Paced: 8.6 %
Brady Statistic RV Percent Paced: 1 %
Date Time Interrogation Session: 20210913015021
Implantable Lead Implant Date: 20070119
Implantable Lead Implant Date: 20070119
Implantable Lead Location: 753859
Implantable Lead Location: 753860
Implantable Pulse Generator Implant Date: 20171017
Lead Channel Impedance Value: 400 Ohm
Lead Channel Impedance Value: 440 Ohm
Lead Channel Pacing Threshold Amplitude: 0.5 V
Lead Channel Pacing Threshold Amplitude: 0.75 V
Lead Channel Pacing Threshold Pulse Width: 0.4 ms
Lead Channel Pacing Threshold Pulse Width: 0.4 ms
Lead Channel Sensing Intrinsic Amplitude: 2.7 mV
Lead Channel Sensing Intrinsic Amplitude: 5 mV
Lead Channel Setting Pacing Amplitude: 2 V
Lead Channel Setting Pacing Amplitude: 2.5 V
Lead Channel Setting Pacing Pulse Width: 0.4 ms
Lead Channel Setting Sensing Sensitivity: 2 mV
Pulse Gen Model: 2272
Pulse Gen Serial Number: 7961150

## 2020-05-21 NOTE — Progress Notes (Signed)
Remote pacemaker transmission.   

## 2020-07-03 ENCOUNTER — Other Ambulatory Visit: Payer: Self-pay

## 2020-07-03 ENCOUNTER — Emergency Department (HOSPITAL_COMMUNITY)
Admission: EM | Admit: 2020-07-03 | Discharge: 2020-07-03 | Disposition: A | Discharge status: Home / Self Care | Payer: Self-pay | Attending: Emergency Medicine | Admitting: Emergency Medicine

## 2020-07-03 ENCOUNTER — Encounter (HOSPITAL_COMMUNITY): Payer: Self-pay | Admitting: Emergency Medicine

## 2020-07-03 ENCOUNTER — Emergency Department (HOSPITAL_COMMUNITY): Payer: Self-pay

## 2020-07-03 DIAGNOSIS — B353 Tinea pedis: Secondary | ICD-10-CM | POA: Insufficient documentation

## 2020-07-03 DIAGNOSIS — Z45018 Encounter for adjustment and management of other part of cardiac pacemaker: Secondary | ICD-10-CM

## 2020-07-03 DIAGNOSIS — W109XXA Fall (on) (from) unspecified stairs and steps, initial encounter: Secondary | ICD-10-CM | POA: Insufficient documentation

## 2020-07-03 DIAGNOSIS — Z87891 Personal history of nicotine dependence: Secondary | ICD-10-CM | POA: Insufficient documentation

## 2020-07-03 DIAGNOSIS — I2510 Atherosclerotic heart disease of native coronary artery without angina pectoris: Secondary | ICD-10-CM | POA: Insufficient documentation

## 2020-07-03 DIAGNOSIS — Z950 Presence of cardiac pacemaker: Secondary | ICD-10-CM | POA: Insufficient documentation

## 2020-07-03 DIAGNOSIS — S5002XA Contusion of left elbow, initial encounter: Secondary | ICD-10-CM | POA: Insufficient documentation

## 2020-07-03 DIAGNOSIS — M79671 Pain in right foot: Secondary | ICD-10-CM | POA: Insufficient documentation

## 2020-07-03 MED ORDER — CLOTRIMAZOLE-BETAMETHASONE 1-0.05 % EX CREA: TOPICAL_CREAM | CUTANEOUS | 0 refills | Status: DC
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
## 2020-07-03 NOTE — ED Notes (Signed)
Pacemaker has been accessed and interrogated by rep

## 2020-07-03 NOTE — ED Triage Notes (Signed)
Pt reports left elbow and right foot pain. Pt also stating "I need my pacemaker checked, I don't think my transmitter at home is working properly." Pt denies chest pain or SHOB.

## 2020-07-03 NOTE — ED Notes (Signed)
Pt now reports athletes foot

## 2020-07-03 NOTE — ED Notes (Signed)
Pt is a long distance trucker   OOT for the last 2 months  Also reports L elbow and R foot pain after a fall from his truck several weeks ago   Reports a [employer] pacemaker   Desires for it to be checked as he missed and appt in July   Pacer supposed to be checked from home and he wonders if it is working   Pt on monitor

## 2020-07-03 NOTE — Discharge Instructions (Addendum)
Call Dr. Johney Frame to schedule a follow-up appt next week.  The xray of your elbow did not show a broken bone.  Wash your feet with mild soap and water and it is very important that you dry your foot in between your toes then apply the cream as directed.

## 2020-07-03 NOTE — ED Notes (Signed)
Pacemaker was interrogated 05/17/20

## 2020-07-15 ENCOUNTER — Ambulatory Visit (INDEPENDENT_AMBULATORY_CARE_PROVIDER_SITE_OTHER): Payer: Self-pay | Admitting: Internal Medicine

## 2020-07-15 ENCOUNTER — Other Ambulatory Visit: Payer: Self-pay

## 2020-07-15 ENCOUNTER — Encounter: Payer: Self-pay | Admitting: Internal Medicine

## 2020-07-15 VITALS — BP 116/82 | HR 69 | Ht 70.0 in | Wt 157.2 lb

## 2020-07-15 DIAGNOSIS — R001 Bradycardia, unspecified: Secondary | ICD-10-CM

## 2020-07-15 DIAGNOSIS — I471 Supraventricular tachycardia: Secondary | ICD-10-CM

## 2020-07-15 DIAGNOSIS — Z950 Presence of cardiac pacemaker: Secondary | ICD-10-CM

## 2020-07-15 DIAGNOSIS — R55 Syncope and collapse: Secondary | ICD-10-CM

## 2020-07-15 DIAGNOSIS — I4891 Unspecified atrial fibrillation: Secondary | ICD-10-CM
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
## 2020-07-15 NOTE — Patient Instructions (Addendum)
Medication Instructions:  Your physician recommends that you continue on your current medications as directed. Please refer to the Current Medication list given to you today.  *If you need a refill on your cardiac medications before your next appointment, please call your pharmacy*  Lab Work: None ordered.  If you have labs (blood work) drawn today and your tests are completely normal, you will receive your results only by: Marland Kitchen MyChart Message (if you have MyChart) OR . A paper copy in the mail If you have any lab test that is abnormal or we need to change your treatment, we will call you to review the results.  Testing/Procedures: None ordered.  Follow-Up: At [facility], you and your health needs are our priority.  As part of our continuing mission to provide you with exceptional heart care, we have created designated Provider Care Teams.  These Care Teams include your primary Cardiologist (physician) and Advanced Practice Providers (APPs -  Physician Assistants and Nurse Practitioners) who all work together to provide you with the care you need, when you need it.  We recommend signing up for the patient portal called "MyChart".  Sign up information is provided on this After Visit Summary.  MyChart is used to connect with patients for Virtual Visits (Telemedicine).  Patients are able to view lab/test results, encounter notes, upcoming appointments, etc.  Non-urgent messages can be sent to your provider as well.   To learn more about what you can do with MyChart, go to [email or web address].    Your next appointment:   Your physician wants you to follow-up in: 1 year with Dr. Johney Frame.  You will receive a reminder letter in the mail two months in advance. If you don't receive a letter, please call our office to schedule the follow-up appointment.  Remote monitoring is used to monitor your Pacemaker from home. This monitoring reduces the number of office visits required to check your device  to one time per year. It allows [place] to keep an eye on the functioning of your device to ensure it is working properly. You are scheduled for a device check from home on 08/19/20. You may send your transmission at any time that day. If you have a wireless device, the transmission will be sent automatically. After your physician reviews your transmission, you will receive a postcard with your next transmission date.  Other Instructions:

## 2020-07-16 ENCOUNTER — Telehealth: Payer: Self-pay | Admitting: Internal Medicine

## 2020-07-16 NOTE — Telephone Encounter (Signed)
New Message:     Pt said he saw Dr Johney Frame yesterday and was supposed to caall and give the name of the medicine he is taking for the fungus on his foot. The name of the medicine is Clotrimazole Betamethasonee the Brand Name is Clotrim- Beta.

## 2020-08-03 ENCOUNTER — Other Ambulatory Visit: Payer: Self-pay | Admitting: Internal Medicine

## 2020-08-04 ENCOUNTER — Telehealth: Payer: Self-pay | Admitting: Internal Medicine

## 2020-08-04 NOTE — Telephone Encounter (Signed)
[facility]bbott is calling requesting a referral with his medical records be sent to a Cardiologist located in [place]. Please advise.

## 2020-08-05 NOTE — Telephone Encounter (Signed)
Patient states he changed his mind and does not want a referral sent to [place].

## 2020-09-09 ENCOUNTER — Emergency Department (HOSPITAL_COMMUNITY)
Admission: EM | Admit: 2020-09-09 | Discharge: 2020-09-09 | Disposition: A | Discharge status: Home / Self Care | Payer: Self-pay | Attending: Emergency Medicine | Admitting: Emergency Medicine

## 2020-09-09 ENCOUNTER — Encounter (HOSPITAL_COMMUNITY): Payer: Self-pay | Admitting: Emergency Medicine

## 2020-09-09 ENCOUNTER — Other Ambulatory Visit: Payer: Self-pay

## 2020-09-09 DIAGNOSIS — Z5321 Procedure and treatment not carried out due to patient leaving prior to being seen by health care provider: Secondary | ICD-10-CM | POA: Insufficient documentation

## 2020-09-09 DIAGNOSIS — M79671 Pain in right foot: Secondary | ICD-10-CM | POA: Insufficient documentation

## 2020-09-09 NOTE — ED Triage Notes (Signed)
Pt c/o rt foot pain due to blisters on the top and sides.  Pt states it has been that way since October 2021.

## 2020-11-18 ENCOUNTER — Ambulatory Visit (INDEPENDENT_AMBULATORY_CARE_PROVIDER_SITE_OTHER): Payer: Self-pay

## 2020-11-18 DIAGNOSIS — I495 Sick sinus syndrome: Secondary | ICD-10-CM

## 2020-11-22 LAB — CUP PACEART REMOTE DEVICE CHECK
Battery Remaining Longevity: 134 mo
Battery Remaining Percentage: 95.5 %
Battery Voltage: 3.01 V
Brady Statistic AP VP Percent: 1 %
Brady Statistic AP VS Percent: 8.3 %
Brady Statistic AS VP Percent: 1 %
Brady Statistic AS VS Percent: 92 %
Brady Statistic RA Percent Paced: 8.2 %
Brady Statistic RV Percent Paced: 1 %
Date Time Interrogation Session: 20220319205936
Implantable Lead Implant Date: 20070119
Implantable Lead Implant Date: 20070119
Implantable Lead Location: 753859
Implantable Lead Location: 753860
Implantable Pulse Generator Implant Date: 20171017
Lead Channel Impedance Value: 380 Ohm
Lead Channel Impedance Value: 410 Ohm
Lead Channel Pacing Threshold Amplitude: 0.5 V
Lead Channel Pacing Threshold Amplitude: 0.75 V
Lead Channel Pacing Threshold Pulse Width: 0.4 ms
Lead Channel Pacing Threshold Pulse Width: 0.4 ms
Lead Channel Sensing Intrinsic Amplitude: 3.6 mV
Lead Channel Sensing Intrinsic Amplitude: 5.4 mV
Lead Channel Setting Pacing Amplitude: 1 V
Lead Channel Setting Pacing Amplitude: 1.5 V
Lead Channel Setting Pacing Pulse Width: 0.4 ms
Lead Channel Setting Sensing Sensitivity: 2 mV
Pulse Gen Model: 2272
Pulse Gen Serial Number: 7961150

## 2020-11-26 NOTE — Progress Notes (Signed)
Remote pacemaker transmission.   

## 2021-02-28 ENCOUNTER — Ambulatory Visit (INDEPENDENT_AMBULATORY_CARE_PROVIDER_SITE_OTHER): Payer: Self-pay

## 2021-02-28 DIAGNOSIS — R55 Syncope and collapse: Secondary | ICD-10-CM

## 2021-02-28 LAB — CUP PACEART REMOTE DEVICE CHECK
Battery Remaining Longevity: 85 mo
Battery Remaining Percentage: 65 %
Battery Voltage: 3.01 V
Brady Statistic AP VP Percent: 1 %
Brady Statistic AP VS Percent: 7.7 %
Brady Statistic AS VP Percent: 1 %
Brady Statistic AS VS Percent: 92 %
Brady Statistic RA Percent Paced: 7.6 %
Brady Statistic RV Percent Paced: 1 %
Date Time Interrogation Session: 20220625233642
Implantable Lead Implant Date: 20070119
Implantable Lead Implant Date: 20070119
Implantable Lead Location: 753859
Implantable Lead Location: 753860
Implantable Pulse Generator Implant Date: 20171017
Lead Channel Impedance Value: 340 Ohm
Lead Channel Impedance Value: 410 Ohm
Lead Channel Pacing Threshold Amplitude: 0.625 V
Lead Channel Pacing Threshold Amplitude: 0.75 V
Lead Channel Pacing Threshold Pulse Width: 0.4 ms
Lead Channel Pacing Threshold Pulse Width: 0.4 ms
Lead Channel Sensing Intrinsic Amplitude: 4.7 mV
Lead Channel Sensing Intrinsic Amplitude: 5 mV
Lead Channel Setting Pacing Amplitude: 1 V
Lead Channel Setting Pacing Amplitude: 1.625
Lead Channel Setting Pacing Pulse Width: 0.4 ms
Lead Channel Setting Sensing Sensitivity: 2 mV
Pulse Gen Model: 2272
Pulse Gen Serial Number: 7961150

## 2021-03-16 NOTE — Progress Notes (Signed)
Remote pacemaker transmission.   

## 2021-03-28 ENCOUNTER — Telehealth: Payer: Self-pay | Admitting: Internal Medicine

## 2021-03-28 NOTE — Telephone Encounter (Signed)
HIM received faxed paperwork from the [facility] (Disability) requesting medical records as well as the Physician to fill out paperwork.The paperwork has been put in Dr. Jenel Lucks box to be completed.  AO 03/28/21

## 2021-04-27 ENCOUNTER — Emergency Department (HOSPITAL_COMMUNITY)
Admission: EM | Admit: 2021-04-27 | Discharge: 2021-04-27 | Disposition: A | Discharge status: Home / Self Care | Payer: Self-pay | Attending: Emergency Medicine | Admitting: Emergency Medicine

## 2021-04-27 ENCOUNTER — Encounter (HOSPITAL_COMMUNITY): Payer: Self-pay | Admitting: *Deleted

## 2021-04-27 ENCOUNTER — Other Ambulatory Visit: Payer: Self-pay

## 2021-04-27 DIAGNOSIS — M545 Low back pain, unspecified: Secondary | ICD-10-CM | POA: Insufficient documentation

## 2021-04-27 DIAGNOSIS — Z5321 Procedure and treatment not carried out due to patient leaving prior to being seen by health care provider: Secondary | ICD-10-CM | POA: Insufficient documentation

## 2021-04-27 NOTE — ED Triage Notes (Signed)
Pt with lower back pain since 8/6, seen in ED in [facility] for same. Pt believes he aggravated back with heavy lifting with his work.

## 2021-05-30 ENCOUNTER — Ambulatory Visit (INDEPENDENT_AMBULATORY_CARE_PROVIDER_SITE_OTHER): Payer: Self-pay

## 2021-05-30 DIAGNOSIS — I495 Sick sinus syndrome: Secondary | ICD-10-CM

## 2021-05-31 LAB — CUP PACEART REMOTE DEVICE CHECK
Battery Remaining Longevity: 83 mo
Battery Remaining Percentage: 63 %
Battery Voltage: 2.99 V
Brady Statistic AP VP Percent: 1 %
Brady Statistic AP VS Percent: 8.7 %
Brady Statistic AS VP Percent: 1 %
Brady Statistic AS VS Percent: 91 %
Brady Statistic RA Percent Paced: 8.6 %
Brady Statistic RV Percent Paced: 1 %
Date Time Interrogation Session: 20220926062915
Implantable Lead Implant Date: 20070119
Implantable Lead Implant Date: 20070119
Implantable Lead Location: 753859
Implantable Lead Location: 753860
Implantable Pulse Generator Implant Date: 20171017
Lead Channel Impedance Value: 380 Ohm
Lead Channel Impedance Value: 430 Ohm
Lead Channel Pacing Threshold Amplitude: 0.5 V
Lead Channel Pacing Threshold Amplitude: 0.875 V
Lead Channel Pacing Threshold Pulse Width: 0.4 ms
Lead Channel Pacing Threshold Pulse Width: 0.4 ms
Lead Channel Sensing Intrinsic Amplitude: 4.3 mV
Lead Channel Sensing Intrinsic Amplitude: 5.5 mV
Lead Channel Setting Pacing Amplitude: 1.125
Lead Channel Setting Pacing Amplitude: 1.5 V
Lead Channel Setting Pacing Pulse Width: 0.4 ms
Lead Channel Setting Sensing Sensitivity: 2 mV
Pulse Gen Model: 2272
Pulse Gen Serial Number: 7961150

## 2021-06-03 NOTE — Progress Notes (Signed)
Remote pacemaker transmission.   

## 2021-07-18 ENCOUNTER — Other Ambulatory Visit: Payer: Self-pay

## 2021-07-18 ENCOUNTER — Encounter: Payer: Self-pay | Admitting: Internal Medicine

## 2021-07-18 ENCOUNTER — Ambulatory Visit (INDEPENDENT_AMBULATORY_CARE_PROVIDER_SITE_OTHER): Payer: Self-pay | Admitting: Internal Medicine

## 2021-07-18 VITALS — BP 130/74 | HR 60 | Ht 70.0 in | Wt 167.2 lb

## 2021-07-18 DIAGNOSIS — R001 Bradycardia, unspecified: Secondary | ICD-10-CM

## 2021-07-18 DIAGNOSIS — I471 Supraventricular tachycardia: Secondary | ICD-10-CM

## 2021-07-18 DIAGNOSIS — I4891 Unspecified atrial fibrillation: Secondary | ICD-10-CM

## 2021-07-18 LAB — CUP PACEART INCLINIC DEVICE CHECK
Battery Remaining Longevity: 86 mo
Battery Voltage: 2.99 V
Brady Statistic RA Percent Paced: 9.3 %
Brady Statistic RV Percent Paced: 0.15 %
Date Time Interrogation Session: 20221114102615
Implantable Lead Implant Date: 20070119
Implantable Lead Implant Date: 20070119
Implantable Lead Location: 753859
Implantable Lead Location: 753860
Implantable Pulse Generator Implant Date: 20171017
Lead Channel Impedance Value: 362.5 Ohm
Lead Channel Impedance Value: 412.5 Ohm
Lead Channel Pacing Threshold Amplitude: 0.625 V
Lead Channel Pacing Threshold Amplitude: 0.75 V
Lead Channel Pacing Threshold Pulse Width: 0.4 ms
Lead Channel Pacing Threshold Pulse Width: 0.4 ms
Lead Channel Sensing Intrinsic Amplitude: 4.5 mV
Lead Channel Sensing Intrinsic Amplitude: 5 mV
Lead Channel Setting Pacing Amplitude: 1 V
Lead Channel Setting Pacing Amplitude: 1.625
Lead Channel Setting Pacing Pulse Width: 0.4 ms
Lead Channel Setting Sensing Sensitivity: 2 mV
Pulse Gen Model: 2272
Pulse Gen Serial Number: 7961150
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
## 2021-07-18 NOTE — Patient Instructions (Addendum)
Medication Instructions:  Your physician recommends that you continue on your current medications as directed. Please refer to the Current Medication list given to you today. *If you need a refill on your cardiac medications before your next appointment, please call your pharmacy*  Lab Work: None. If you have labs (blood work) drawn today and your tests are completely normal, you will receive your results only by: MyChart Message (if you have MyChart) OR A paper copy in the mail If you have any lab test that is abnormal or we need to change your treatment, we will call you to review the results.  Testing/Procedures: None.  Follow-Up: At [facility], you and your health needs are our priority.  As part of our continuing mission to provide you with exceptional heart care, we have created designated Provider Care Teams.  These Care Teams include your primary Cardiologist (physician) and Advanced Practice Providers (APPs -  Physician Assistants and Nurse Practitioners) who all work together to provide you with the care you need, when you need it.  Your physician wants you to follow-up in: 12 months with  one of the following Advanced Practice Providers on your designated Care Team:    Casimiro Needle "Mardelle Matte" Lanna Poche, [place]   You will receive a reminder letter in the mail two months in advance. If you don't receive a letter, please call our office to schedule the follow-up appointment.  Remote monitoring is used to monitor your Pacemaker from home. This monitoring reduces the number of office visits required to check your device to one time per year. It allows [place] to keep an eye on the functioning of your device to ensure it is working properly. You are scheduled for a device check from home on 08/30/21. You may send your transmission at any time that day. If you have a wireless device, the transmission will be sent automatically. After your physician reviews your transmission, you will receive a postcard  with your next transmission date.  We recommend signing up for the patient portal called "MyChart".  Sign up information is provided on this After Visit Summary.  MyChart is used to connect with patients for Virtual Visits (Telemedicine).  Patients are able to view lab/test results, encounter notes, upcoming appointments, etc.  Non-urgent messages can be sent to your provider as well.   To learn more about what you can do with MyChart, go to [email or web address].    Any Other Special Instructions Will Be Listed Below (If Applicable).

## 2021-08-30 ENCOUNTER — Ambulatory Visit (INDEPENDENT_AMBULATORY_CARE_PROVIDER_SITE_OTHER): Payer: Self-pay

## 2021-08-30 DIAGNOSIS — I495 Sick sinus syndrome: Secondary | ICD-10-CM

## 2021-08-30 LAB — CUP PACEART REMOTE DEVICE CHECK
Battery Remaining Longevity: 80 mo
Battery Remaining Percentage: 61 %
Battery Voltage: 2.99 V
Brady Statistic AP VP Percent: 1 %
Brady Statistic AP VS Percent: 7.7 %
Brady Statistic AS VP Percent: 1 %
Brady Statistic AS VS Percent: 92 %
Brady Statistic RA Percent Paced: 7.6 %
Brady Statistic RV Percent Paced: 1 %
Date Time Interrogation Session: 20221226040827
Implantable Lead Implant Date: 20070119
Implantable Lead Implant Date: 20070119
Implantable Lead Location: 753859
Implantable Lead Location: 753860
Implantable Pulse Generator Implant Date: 20171017
Lead Channel Impedance Value: 340 Ohm
Lead Channel Impedance Value: 400 Ohm
Lead Channel Pacing Threshold Amplitude: 0.5 V
Lead Channel Pacing Threshold Amplitude: 0.875 V
Lead Channel Pacing Threshold Pulse Width: 0.4 ms
Lead Channel Pacing Threshold Pulse Width: 0.4 ms
Lead Channel Sensing Intrinsic Amplitude: 3.9 mV
Lead Channel Sensing Intrinsic Amplitude: 4.7 mV
Lead Channel Setting Pacing Amplitude: 1.125
Lead Channel Setting Pacing Amplitude: 1.5 V
Lead Channel Setting Pacing Pulse Width: 0.4 ms
Lead Channel Setting Sensing Sensitivity: 2 mV
Pulse Gen Model: 2272
Pulse Gen Serial Number: 7961150

## 2021-09-07 NOTE — Progress Notes (Signed)
Remote pacemaker transmission.   

## 2021-09-24 ENCOUNTER — Encounter (HOSPITAL_COMMUNITY): Payer: Self-pay | Admitting: *Deleted

## 2021-09-24 ENCOUNTER — Emergency Department (HOSPITAL_COMMUNITY)
Admission: EM | Admit: 2021-09-24 | Discharge: 2021-09-24 | Disposition: A | Discharge status: Home / Self Care | Payer: Medicaid Other | Attending: Emergency Medicine | Admitting: Emergency Medicine

## 2021-09-24 DIAGNOSIS — M5489 Other dorsalgia: Secondary | ICD-10-CM | POA: Diagnosis not present

## 2021-09-24 DIAGNOSIS — Z5321 Procedure and treatment not carried out due to patient leaving prior to being seen by health care provider: Secondary | ICD-10-CM | POA: Insufficient documentation

## 2021-09-24 DIAGNOSIS — M542 Cervicalgia: Secondary | ICD-10-CM | POA: Diagnosis not present

## 2021-09-24 DIAGNOSIS — M25512 Pain in left shoulder: Secondary | ICD-10-CM | POA: Insufficient documentation

## 2021-09-24 NOTE — ED Triage Notes (Signed)
Pain in left shoulder, left neck and back for 2 weeks, worse with movement

## 2021-09-25 ENCOUNTER — Other Ambulatory Visit: Payer: Self-pay

## 2021-09-25 ENCOUNTER — Ambulatory Visit
Admission: EM | Admit: 2021-09-25 | Discharge: 2021-09-25 | Disposition: A | Discharge status: Home / Self Care | Payer: Self-pay | Attending: Urgent Care | Admitting: Urgent Care

## 2021-09-25 ENCOUNTER — Encounter: Payer: Self-pay | Admitting: Emergency Medicine

## 2021-09-25 DIAGNOSIS — R001 Bradycardia, unspecified: Secondary | ICD-10-CM

## 2021-09-25 DIAGNOSIS — M542 Cervicalgia: Secondary | ICD-10-CM

## 2021-09-25 DIAGNOSIS — F259 Schizoaffective disorder, unspecified: Secondary | ICD-10-CM

## 2021-09-25 DIAGNOSIS — I519 Heart disease, unspecified: Secondary | ICD-10-CM

## 2021-09-25 DIAGNOSIS — Z950 Presence of cardiac pacemaker: Secondary | ICD-10-CM

## 2021-09-25 DIAGNOSIS — M5030 Other cervical disc degeneration, unspecified cervical region: Secondary | ICD-10-CM

## 2021-09-25 DIAGNOSIS — M5412 Radiculopathy, cervical region: Secondary | ICD-10-CM

## 2021-09-25 MED ORDER — METHOCARBAMOL 500 MG PO TABS: 500.0000 mg | ORAL_TABLET | Freq: Every evening | ORAL | 0 refills | Status: DC | PRN

## 2021-09-25 MED ORDER — PREDNISONE 50 MG PO TABS: 50.0000 mg | ORAL_TABLET | Freq: Every day | ORAL | 0 refills | Status: DC
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
## 2021-09-25 NOTE — ED Triage Notes (Signed)
Pain from neck that radiates down left arm.  States arm feels numb at times.  Pain started about a month ago and has intensified.

## 2021-10-07 ENCOUNTER — Encounter (HOSPITAL_COMMUNITY): Payer: Self-pay | Admitting: Emergency Medicine

## 2021-10-07 ENCOUNTER — Emergency Department (HOSPITAL_COMMUNITY): Payer: No Typology Code available for payment source

## 2021-10-07 ENCOUNTER — Emergency Department (HOSPITAL_COMMUNITY)
Admission: EM | Admit: 2021-10-07 | Discharge: 2021-10-07 | Disposition: A | Discharge status: Home / Self Care | Payer: No Typology Code available for payment source | Attending: Emergency Medicine | Admitting: Emergency Medicine

## 2021-10-07 ENCOUNTER — Other Ambulatory Visit: Payer: Self-pay

## 2021-10-07 DIAGNOSIS — M546 Pain in thoracic spine: Secondary | ICD-10-CM | POA: Diagnosis present

## 2021-10-07 DIAGNOSIS — M791 Myalgia, unspecified site: Secondary | ICD-10-CM

## 2021-10-07 DIAGNOSIS — Y92410 Unspecified street and highway as the place of occurrence of the external cause: Secondary | ICD-10-CM | POA: Insufficient documentation

## 2021-10-07 MED ORDER — ACETAMINOPHEN 325 MG PO TABS
650.0000 mg | ORAL_TABLET | Freq: Once | ORAL | Status: AC
  Administered 2021-10-07: 650 mg via ORAL
  Filled 2021-10-07: qty 2

## 2021-10-07 MED ORDER — IBUPROFEN 600 MG PO TABS: 600.0000 mg | ORAL_TABLET | Freq: Four times a day (QID) | ORAL | 0 refills | Status: AC | PRN

## 2021-10-07 MED ORDER — IBUPROFEN 400 MG PO TABS
600.0000 mg | ORAL_TABLET | Freq: Once | ORAL | Status: AC
  Administered 2021-10-07: 600 mg via ORAL
  Filled 2021-10-07: qty 1
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
## 2021-10-07 NOTE — ED Notes (Signed)
Pt verbalized understanding of d/c instructions, meds, and followup care. Denies questions. VSS, no distress noted. Steady gait to exit with all belongings.  ?
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
## 2021-12-07 ENCOUNTER — Ambulatory Visit (INDEPENDENT_AMBULATORY_CARE_PROVIDER_SITE_OTHER): Payer: Self-pay

## 2021-12-07 DIAGNOSIS — I495 Sick sinus syndrome: Secondary | ICD-10-CM

## 2021-12-08 ENCOUNTER — Telehealth: Payer: Self-pay

## 2021-12-08 DIAGNOSIS — R55 Syncope and collapse: Secondary | ICD-10-CM

## 2021-12-08 LAB — CUP PACEART REMOTE DEVICE CHECK
Battery Remaining Longevity: 60 mo
Battery Remaining Percentage: 59 %
Battery Voltage: 2.99 V
Brady Statistic AP VP Percent: 1 %
Brady Statistic AP VS Percent: 8.5 %
Brady Statistic AS VP Percent: 1 %
Brady Statistic AS VS Percent: 91 %
Brady Statistic RA Percent Paced: 8.3 %
Brady Statistic RV Percent Paced: 1 %
Date Time Interrogation Session: 20230404215341
Implantable Lead Implant Date: 20070119
Implantable Lead Implant Date: 20070119
Implantable Lead Location: 753859
Implantable Lead Location: 753860
Implantable Pulse Generator Implant Date: 20171017
Lead Channel Impedance Value: 410 Ohm
Lead Channel Impedance Value: 440 Ohm
Lead Channel Pacing Threshold Amplitude: 0.625 V
Lead Channel Pacing Threshold Amplitude: 1 V
Lead Channel Pacing Threshold Pulse Width: 0.4 ms
Lead Channel Pacing Threshold Pulse Width: 0.4 ms
Lead Channel Sensing Intrinsic Amplitude: 4.4 mV
Lead Channel Sensing Intrinsic Amplitude: 9 mV
Lead Channel Setting Pacing Amplitude: 1.625
Lead Channel Setting Pacing Amplitude: 5 V
Lead Channel Setting Pacing Pulse Width: 0.5 ms
Lead Channel Setting Sensing Sensitivity: 2 mV
Pulse Gen Model: 2272
Pulse Gen Serial Number: 7961150

## 2021-12-08 NOTE — Telephone Encounter (Signed)
Scheduled remote reviewed. Normal device function.   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 ?Route to triage ?LA ? ?Successful telephone encounter to patient to discuss need for manual testing and reprogramming for V auto polarity. Patient agrees to divice clinic appointment Friday 12/09/21 at 9:00 a at [facility].  ? ? ? ? ? ? ? ?

## 2021-12-08 NOTE — Telephone Encounter (Signed)
Erroneous encounter

## 2021-12-09 ENCOUNTER — Ambulatory Visit (INDEPENDENT_AMBULATORY_CARE_PROVIDER_SITE_OTHER): Payer: Self-pay

## 2021-12-09 DIAGNOSIS — R55 Syncope and collapse: Secondary | ICD-10-CM

## 2021-12-09 NOTE — Progress Notes (Signed)
Pacemaker check in clinic due to RV polarity switch and RV amplitude  at 5.0V. RV sensing ran bipolar with 4.17mV result, which is on trend with prior bipolar RV sensing trends. RV capture noted 0.5V@0 .66ms RV output set 2.5V. RV impedance bipolar ran multiple times with a result of 410ohms consistent with previous year bipolar RV impedance trends. RV sensing and pacing programmed back to bipolar, RV output set 2.5V@0 .96ms auotcapture turned off, of note patient VP<1% of the time. Merlin 03/08/22 ?

## 2021-12-22 NOTE — Progress Notes (Signed)
Remote pacemaker transmission.   

## 2022-03-08 ENCOUNTER — Ambulatory Visit (INDEPENDENT_AMBULATORY_CARE_PROVIDER_SITE_OTHER): Payer: Self-pay

## 2022-03-08 DIAGNOSIS — I495 Sick sinus syndrome: Secondary | ICD-10-CM

## 2022-03-10 LAB — CUP PACEART REMOTE DEVICE CHECK
Battery Remaining Longevity: 70 mo
Battery Remaining Percentage: 57 %
Battery Voltage: 2.99 V
Brady Statistic AP VP Percent: 1 %
Brady Statistic AP VS Percent: 10 %
Brady Statistic AS VP Percent: 1 %
Brady Statistic AS VS Percent: 89 %
Brady Statistic RA Percent Paced: 10 %
Brady Statistic RV Percent Paced: 1 %
Date Time Interrogation Session: 20230702003607
Implantable Lead Implant Date: 20070119
Implantable Lead Implant Date: 20070119
Implantable Lead Location: 753859
Implantable Lead Location: 753860
Implantable Pulse Generator Implant Date: 20171017
Lead Channel Impedance Value: 360 Ohm
Lead Channel Impedance Value: 430 Ohm
Lead Channel Pacing Threshold Amplitude: 0.625 V
Lead Channel Pacing Threshold Amplitude: 0.75 V
Lead Channel Pacing Threshold Pulse Width: 0.4 ms
Lead Channel Pacing Threshold Pulse Width: 0.5 ms
Lead Channel Sensing Intrinsic Amplitude: 4.3 mV
Lead Channel Sensing Intrinsic Amplitude: 5 mV
Lead Channel Setting Pacing Amplitude: 1.625
Lead Channel Setting Pacing Amplitude: 2.5 V
Lead Channel Setting Pacing Pulse Width: 0.5 ms
Lead Channel Setting Sensing Sensitivity: 2 mV
Pulse Gen Model: 2272
Pulse Gen Serial Number: 7961150

## 2022-03-29 NOTE — Progress Notes (Signed)
Remote pacemaker transmission.   
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
## 2022-08-24 ENCOUNTER — Ambulatory Visit: Payer: Self-pay | Attending: Student | Admitting: Student

## 2022-08-24 ENCOUNTER — Encounter: Payer: Self-pay | Admitting: Student

## 2022-08-24 VITALS — BP 112/80 | HR 63 | Ht 70.0 in | Wt 163.6 lb

## 2022-08-24 DIAGNOSIS — I4891 Unspecified atrial fibrillation: Secondary | ICD-10-CM

## 2022-08-24 DIAGNOSIS — R001 Bradycardia, unspecified: Secondary | ICD-10-CM

## 2022-08-24 LAB — CUP PACEART INCLINIC DEVICE CHECK
Battery Remaining Longevity: 73 mo
Battery Voltage: 2.99 V
Brady Statistic RA Percent Paced: 15 %
Brady Statistic RV Percent Paced: 0.83 %
Date Time Interrogation Session: 20231221121618
Implantable Lead Connection Status: 753985
Implantable Lead Connection Status: 753985
Implantable Lead Implant Date: 20070119
Implantable Lead Implant Date: 20070119
Implantable Lead Location: 753859
Implantable Lead Location: 753860
Implantable Pulse Generator Implant Date: 20171017
Lead Channel Impedance Value: 362.5 Ohm
Lead Channel Impedance Value: 375 Ohm
Lead Channel Pacing Threshold Amplitude: 0.75 V
Lead Channel Pacing Threshold Amplitude: 0.75 V
Lead Channel Pacing Threshold Amplitude: 0.75 V
Lead Channel Pacing Threshold Pulse Width: 0.4 ms
Lead Channel Pacing Threshold Pulse Width: 0.5 ms
Lead Channel Pacing Threshold Pulse Width: 0.5 ms
Lead Channel Sensing Intrinsic Amplitude: 3.2 mV
Lead Channel Sensing Intrinsic Amplitude: 4.7 mV
Lead Channel Setting Pacing Amplitude: 1.75 V
Lead Channel Setting Pacing Amplitude: 2.5 V
Lead Channel Setting Pacing Pulse Width: 0.5 ms
Lead Channel Setting Sensing Sensitivity: 2 mV
Pulse Gen Model: 2272
Pulse Gen Serial Number: 7961150

## 2022-08-24 NOTE — Patient Instructions (Signed)
Medication Instructions:  Your physician recommends that you continue on your current medications as directed. Please refer to the Current Medication list given to you today.  *If you need a refill on your cardiac medications before your next appointment, please call your pharmacy*   Lab Work: None If you have labs (blood work) drawn today and your tests are completely normal, you will receive your results only by: MyChart Message (if you have MyChart) OR A paper copy in the mail If you have any lab test that is abnormal or we need to change your treatment, we will call you to review the results.   Follow-Up: At [facility], you and your health needs are our priority.  As part of our continuing mission to provide you with exceptional heart care, we have created designated Provider Care Teams.  These Care Teams include your primary Cardiologist (physician) and Advanced Practice Providers (APPs -  Physician Assistants and Nurse Practitioners) who all work together to provide you with the care you need, when you need it.  We recommend signing up for the patient portal called "MyChart".  Sign up information is provided on this After Visit Summary.  MyChart is used to connect with patients for Virtual Visits (Telemedicine).  Patients are able to view lab/test results, encounter notes, upcoming appointments, etc.  Non-urgent messages can be sent to your provider as well.   To learn more about what you can do with MyChart, go to [email or web address].    Your next appointment:   1 year(s)  The format for your next appointment:   In Person  Provider:   York Pellant, MD     Important Information About Sugar

## 2022-09-06 ENCOUNTER — Ambulatory Visit (INDEPENDENT_AMBULATORY_CARE_PROVIDER_SITE_OTHER): Payer: Self-pay

## 2022-09-06 DIAGNOSIS — I495 Sick sinus syndrome: Secondary | ICD-10-CM

## 2022-09-07 ENCOUNTER — Telehealth: Payer: Self-pay | Admitting: Cardiovascular Disease

## 2022-09-07 NOTE — Telephone Encounter (Signed)
Patient is returning call.  °

## 2022-09-07 NOTE — Telephone Encounter (Signed)
Pt states that he received a call from our office and was calling back. Please advise

## 2022-09-07 NOTE — Telephone Encounter (Signed)
Attempted to call patient. Unable to leave voicemail. I am not sure who tried to contact the patient

## 2022-09-07 NOTE — Telephone Encounter (Signed)
Spoke with patient. He had a missed call but no message was left. Do not see any documentation of anyone trying to call patient.

## 2022-09-12 LAB — CUP PACEART REMOTE DEVICE CHECK
Battery Remaining Longevity: 63 mo
Battery Remaining Percentage: 53 %
Battery Voltage: 2.99 V
Brady Statistic AP VP Percent: 1 %
Brady Statistic AP VS Percent: 12 %
Brady Statistic AS VP Percent: 1.2 %
Brady Statistic AS VS Percent: 87 %
Brady Statistic RA Percent Paced: 11 %
Brady Statistic RV Percent Paced: 1.3 %
Date Time Interrogation Session: 20240106105934
Implantable Lead Connection Status: 753985
Implantable Lead Connection Status: 753985
Implantable Lead Implant Date: 20070119
Implantable Lead Implant Date: 20070119
Implantable Lead Location: 753859
Implantable Lead Location: 753860
Implantable Pulse Generator Implant Date: 20171017
Lead Channel Impedance Value: 340 Ohm
Lead Channel Impedance Value: 380 Ohm
Lead Channel Pacing Threshold Amplitude: 0.625 V
Lead Channel Pacing Threshold Amplitude: 0.75 V
Lead Channel Pacing Threshold Pulse Width: 0.4 ms
Lead Channel Pacing Threshold Pulse Width: 0.5 ms
Lead Channel Sensing Intrinsic Amplitude: 3.8 mV
Lead Channel Sensing Intrinsic Amplitude: 5 mV
Lead Channel Setting Pacing Amplitude: 1.625
Lead Channel Setting Pacing Amplitude: 2.5 V
Lead Channel Setting Pacing Pulse Width: 0.5 ms
Lead Channel Setting Sensing Sensitivity: 2 mV
Pulse Gen Model: 2272
Pulse Gen Serial Number: 7961150

## 2022-10-02 NOTE — Progress Notes (Signed)
Remote pacemaker transmission.   

## 2022-12-18 ENCOUNTER — Ambulatory Visit (INDEPENDENT_AMBULATORY_CARE_PROVIDER_SITE_OTHER): Payer: Medicaid Other

## 2022-12-18 DIAGNOSIS — I495 Sick sinus syndrome: Secondary | ICD-10-CM

## 2022-12-19 LAB — CUP PACEART REMOTE DEVICE CHECK
Battery Remaining Longevity: 60 mo
Battery Remaining Percentage: 50 %
Battery Voltage: 2.99 V
Brady Statistic AP VP Percent: 1 %
Brady Statistic AP VS Percent: 5.5 %
Brady Statistic AS VP Percent: 1.9 %
Brady Statistic AS VS Percent: 93 %
Brady Statistic RA Percent Paced: 5.4 %
Brady Statistic RV Percent Paced: 1.9 %
Date Time Interrogation Session: 20240414224336
Implantable Lead Connection Status: 753985
Implantable Lead Connection Status: 753985
Implantable Lead Implant Date: 20070119
Implantable Lead Implant Date: 20070119
Implantable Lead Location: 753859
Implantable Lead Location: 753860
Implantable Pulse Generator Implant Date: 20171017
Lead Channel Impedance Value: 340 Ohm
Lead Channel Impedance Value: 360 Ohm
Lead Channel Pacing Threshold Amplitude: 0.75 V
Lead Channel Pacing Threshold Amplitude: 0.75 V
Lead Channel Pacing Threshold Pulse Width: 0.4 ms
Lead Channel Pacing Threshold Pulse Width: 0.5 ms
Lead Channel Sensing Intrinsic Amplitude: 3 mV
Lead Channel Sensing Intrinsic Amplitude: 3.9 mV
Lead Channel Setting Pacing Amplitude: 1.75 V
Lead Channel Setting Pacing Amplitude: 2.5 V
Lead Channel Setting Pacing Pulse Width: 0.5 ms
Lead Channel Setting Sensing Sensitivity: 2 mV
Pulse Gen Model: 2272
Pulse Gen Serial Number: 7961150

## 2023-01-19 NOTE — Progress Notes (Signed)
Remote pacemaker transmission.   

## 2023-06-18 ENCOUNTER — Ambulatory Visit (INDEPENDENT_AMBULATORY_CARE_PROVIDER_SITE_OTHER): Payer: Medicaid Other

## 2023-06-18 DIAGNOSIS — I495 Sick sinus syndrome: Secondary | ICD-10-CM | POA: Diagnosis not present

## 2023-06-20 LAB — CUP PACEART REMOTE DEVICE CHECK
Battery Remaining Longevity: 45 mo
Battery Remaining Percentage: 47 %
Battery Voltage: 2.98 V
Brady Statistic AP VP Percent: 1 %
Brady Statistic AP VS Percent: 7.7 %
Brady Statistic AS VP Percent: 1.8 %
Brady Statistic AS VS Percent: 90 %
Brady Statistic RA Percent Paced: 7.7 %
Brady Statistic RV Percent Paced: 1.9 %
Date Time Interrogation Session: 20241009175716
Implantable Lead Connection Status: 753985
Implantable Lead Connection Status: 753985
Implantable Lead Implant Date: 20070119
Implantable Lead Implant Date: 20070119
Implantable Lead Location: 753859
Implantable Lead Location: 753860
Implantable Pulse Generator Implant Date: 20171017
Lead Channel Impedance Value: 340 Ohm
Lead Channel Impedance Value: 350 Ohm
Lead Channel Pacing Threshold Amplitude: 0.75 V
Lead Channel Pacing Threshold Amplitude: 0.75 V
Lead Channel Pacing Threshold Pulse Width: 0.4 ms
Lead Channel Pacing Threshold Pulse Width: 0.5 ms
Lead Channel Sensing Intrinsic Amplitude: 5 mV
Lead Channel Sensing Intrinsic Amplitude: 7 mV
Lead Channel Setting Pacing Amplitude: 1.75 V
Lead Channel Setting Pacing Amplitude: 5 V
Lead Channel Setting Pacing Pulse Width: 0.5 ms
Lead Channel Setting Sensing Sensitivity: 2 mV
Pulse Gen Model: 2272
Pulse Gen Serial Number: 7961150

## 2023-07-02 NOTE — Progress Notes (Signed)
Remote pacemaker transmission.   

## 2023-09-28 ENCOUNTER — Encounter: Payer: Self-pay | Admitting: Cardiovascular Disease

## 2023-09-28 ENCOUNTER — Ambulatory Visit: Payer: Medicaid Other | Attending: Cardiovascular Disease | Admitting: Cardiovascular Disease

## 2023-09-28 VITALS — BP 120/68 | HR 65 | Ht 70.0 in | Wt 165.8 lb

## 2023-09-28 DIAGNOSIS — R55 Syncope and collapse: Secondary | ICD-10-CM

## 2023-09-28 DIAGNOSIS — I495 Sick sinus syndrome: Secondary | ICD-10-CM | POA: Diagnosis not present

## 2023-09-28 DIAGNOSIS — I4891 Unspecified atrial fibrillation: Secondary | ICD-10-CM | POA: Diagnosis not present

## 2023-09-28 DIAGNOSIS — R001 Bradycardia, unspecified: Secondary | ICD-10-CM

## 2023-09-28 LAB — CUP PACEART INCLINIC DEVICE CHECK
Battery Remaining Longevity: 66 mo
Battery Voltage: 2.98 V
Brady Statistic RA Percent Paced: 4 %
Brady Statistic RV Percent Paced: 0 %
Date Time Interrogation Session: 20250124172453
Implantable Lead Connection Status: 753985
Implantable Lead Connection Status: 753985
Implantable Lead Implant Date: 20070119
Implantable Lead Implant Date: 20070119
Implantable Lead Location: 753859
Implantable Lead Location: 753860
Implantable Pulse Generator Implant Date: 20171017
Lead Channel Pacing Threshold Amplitude: 0.625 V
Lead Channel Pacing Threshold Amplitude: 1 V
Lead Channel Pacing Threshold Pulse Width: 0.4 ms
Lead Channel Pacing Threshold Pulse Width: 0.4 ms
Lead Channel Sensing Intrinsic Amplitude: 3 mV
Lead Channel Sensing Intrinsic Amplitude: 5.7 mV
Lead Channel Setting Pacing Amplitude: 2 V
Lead Channel Setting Pacing Amplitude: 2.5 V
Lead Channel Setting Pacing Pulse Width: 0.5 ms
Lead Channel Setting Sensing Sensitivity: 2 mV
Pulse Gen Model: 2272
Pulse Gen Serial Number: 7961150

## 2023-09-28 NOTE — Progress Notes (Signed)
  Electrophysiology Office Note:    Date:  09/28/2023   ID:  Harry Zhang, DOB 02-Aug-1966, MRN [id number]  PCP:  Patient, No Pcp Per   [facility] Providers Cardiologist:  None Electrophysiologist:  [name], MD     Referring MD: No ref. provider found   History of Present Illness:    Harry Zhang is a 58 y.o. male with a medical history significant for sinus bradycardia cardia and Abbott dual-chamber pacemaker, who presents for routine follow-up.     He has a Presenter, broadcasting pacemaker placed in 2007 and a generator change in 2017 for sick sinus syndrome and a history of tacky bradycardia      Today, he has no complaints.he has no device related complaints -- no new tenderness, drainage, redness.   EKGs/Labs/Other Studies Reviewed Today:     Echocardiogram:  TTE 09/28/2023 EF 60-65%     EKG:   EKG Interpretation Date/Time:  Friday September 28 2023 16:48:55 EST Ventricular Rate:  65 PR Interval:  146 QRS Duration:  90 QT Interval:  398 QTC Calculation: 413 R Axis:   91  Text Interpretation: Normal sinus rhythm Rightward axis When compared with ECG of 07-Oct-2021 20:25, sinus rhythm has replaced A-pacing Confirmed by York Pellant [phone]) on 09/28/2023 5:02:57 PM     Physical Exam:    VS:  BP 120/68 (BP Location: Right Arm, Patient Position: Sitting, Cuff Size: Normal)   Pulse 65   Ht 5\' 10"  (1.778 m)   Wt 165 lb 12.8 oz (75.2 kg)   SpO2 99%   BMI 23.79 kg/m     Wt Readings from Last 3 Encounters:  09/28/23 165 lb 12.8 oz (75.2 kg)  08/24/22 163 lb 9.6 oz (74.2 kg)  10/07/21 187 lb 6.3 oz (85 kg)     GEN: Well nourished, well developed in no acute distress CARDIAC: RRR, no murmurs, rubs, gallops RESPIRATORY:  Normal work of breathing MUSCULOSKELETAL: no edema    ASSESSMENT & PLAN:     1. Symptomatic bradycardia s/p St. Jude PPM  Normal PPM function See Pace Art report No changes today None intermittent A  and V noise reversions. Not reproducible on exam today with isometric movements or pocket manipulation.    2. Suspected AF So far on device no true AF     Signed, [name], MD  09/28/2023 5:15 PM    [facility]

## 2023-09-28 NOTE — Patient Instructions (Signed)
Medication Instructions:  Your physician recommends that you continue on your current medications as directed. Please refer to the Current Medication list given to you today. *If you need a refill on your cardiac medications before your next appointment, please call your pharmacy*   Follow-Up: At [facility], you and your health needs are our priority.  As part of our continuing mission to provide you with exceptional heart care, we have created designated Provider Care Teams.  These Care Teams include your primary Cardiologist (physician) and Advanced Practice Providers (APPs -  Physician Assistants and Nurse Practitioners) who all work together to provide you with the care you need, when you need it.  We recommend signing up for the patient portal called "MyChart".  Sign up information is provided on this After Visit Summary.  MyChart is used to connect with patients for Virtual Visits (Telemedicine).  Patients are able to view lab/test results, encounter notes, upcoming appointments, etc.  Non-urgent messages can be sent to your provider as well.   To learn more about what you can do with MyChart, go to [email or web address].    Your next appointment:   1 year(s)  Provider:   York Pellant, MD

## 2023-11-08 ENCOUNTER — Telehealth: Payer: Self-pay

## 2023-11-08 NOTE — Telephone Encounter (Addendum)
 Alert remote transmission:  HVR, V auto polarity switch has occurred, unipolar 340 ohms - route to triage 4 HVR's and 3 AT/AF, all c/w oversensing of both atrial and ventricular lead noise - known Follow up as scheduled. LA, CVRS  Recent adjustments made in January to improve oversensing/noise.   RV has now switched back to unipolar.   Reviewed with Judie Grieve, ST Jude: Patient doesn't pace much, plan to leave the RV pacing unipolar and change RV sensitivity to Bipolar. (We are oversensing myopotentials) Suggest we also turn off the alerts so we don't keep getting them.  At time of changeout, we can address the leads with the probability of giving him new leads.   I will reach out to patient next week and get him scheduled for further adjustments.

## 2023-11-13 NOTE — Telephone Encounter (Signed)
 Left patient a message to call back.   Need to schedule him with me in Device Clinic for 840 Thursday 3/13 so that I can make programming adjustments to his device.

## 2023-11-14 NOTE — Telephone Encounter (Signed)
 Attempted to contact patient for apt. No answer, unable to leave VM.

## 2023-11-15 NOTE — Telephone Encounter (Signed)
 Device Clinic apt made 11/16/23 @ 3:00 pm. Patient does not have car so will work on transportation. If he is unable to make apt he will call back tomorrow to reschedule for next week.

## 2023-11-16 ENCOUNTER — Ambulatory Visit

## 2023-11-21 ENCOUNTER — Ambulatory Visit: Attending: Internal Medicine

## 2023-11-21 DIAGNOSIS — R55 Syncope and collapse: Secondary | ICD-10-CM

## 2023-11-21 LAB — CUP PACEART INCLINIC DEVICE CHECK
Date Time Interrogation Session: 20250319205349
Implantable Lead Connection Status: 753985
Implantable Lead Connection Status: 753985
Implantable Lead Implant Date: 20070119
Implantable Lead Implant Date: 20070119
Implantable Lead Location: 753859
Implantable Lead Location: 753860
Implantable Pulse Generator Implant Date: 20171017
Pulse Gen Model: 2272
Pulse Gen Serial Number: 7961150

## 2023-11-21 NOTE — Progress Notes (Signed)
 Patient seen in device clinic for the following programming changes.   RV sensitivity programmed from unipolar to bipolar. Polarity switch changed to mointor.  Assisted by Roxy Cedar, St. Jude Rep.

## 2023-12-17 ENCOUNTER — Ambulatory Visit (INDEPENDENT_AMBULATORY_CARE_PROVIDER_SITE_OTHER): Payer: Medicaid Other

## 2023-12-17 DIAGNOSIS — R55 Syncope and collapse: Secondary | ICD-10-CM

## 2023-12-17 LAB — CUP PACEART REMOTE DEVICE CHECK
Battery Remaining Longevity: 42 mo
Battery Remaining Percentage: 42 %
Battery Voltage: 2.98 V
Brady Statistic AP VP Percent: 1 %
Brady Statistic AP VS Percent: 11 %
Brady Statistic AS VP Percent: 1 %
Brady Statistic AS VS Percent: 88 %
Brady Statistic RA Percent Paced: 12 %
Brady Statistic RV Percent Paced: 1 %
Date Time Interrogation Session: 20250414020016
Implantable Lead Connection Status: 753985
Implantable Lead Connection Status: 753985
Implantable Lead Implant Date: 20070119
Implantable Lead Implant Date: 20070119
Implantable Lead Location: 753859
Implantable Lead Location: 753860
Implantable Pulse Generator Implant Date: 20171017
Lead Channel Impedance Value: 300 Ohm
Lead Channel Impedance Value: 340 Ohm
Lead Channel Pacing Threshold Amplitude: 0.625 V
Lead Channel Pacing Threshold Amplitude: 0.75 V
Lead Channel Pacing Threshold Pulse Width: 0.4 ms
Lead Channel Pacing Threshold Pulse Width: 0.5 ms
Lead Channel Sensing Intrinsic Amplitude: 3.8 mV
Lead Channel Sensing Intrinsic Amplitude: 4.3 mV
Lead Channel Setting Pacing Amplitude: 2 V
Lead Channel Setting Pacing Amplitude: 5 V
Lead Channel Setting Pacing Pulse Width: 0.5 ms
Lead Channel Setting Sensing Sensitivity: 2 mV
Pulse Gen Model: 2272
Pulse Gen Serial Number: 7961150

## 2023-12-18 ENCOUNTER — Telehealth: Payer: Self-pay | Admitting: Cardiovascular Disease

## 2023-12-18 NOTE — Telephone Encounter (Signed)
 Pt wants to know if it is ok for him to take a dietary supplement called Shilajit (gummies). Requesting cb

## 2023-12-19 NOTE — Telephone Encounter (Signed)
 Spoke with pt regarding gummy supplement. Pt was notified that pharmacist said it should be fine to take the gummies. Pt verbalized understanding. All questions if any were answered.

## 2024-01-01 ENCOUNTER — Encounter: Payer: Self-pay | Admitting: Cardiovascular Disease

## 2024-01-31 NOTE — Progress Notes (Signed)
 Remote pacemaker transmission.

## 2024-03-17 ENCOUNTER — Ambulatory Visit (INDEPENDENT_AMBULATORY_CARE_PROVIDER_SITE_OTHER): Payer: Medicaid Other

## 2024-03-17 DIAGNOSIS — R55 Syncope and collapse: Secondary | ICD-10-CM | POA: Diagnosis not present

## 2024-03-17 LAB — CUP PACEART REMOTE DEVICE CHECK
Battery Remaining Longevity: 40 mo
Battery Remaining Percentage: 40 %
Battery Voltage: 2.98 V
Brady Statistic AP VP Percent: 1 %
Brady Statistic AP VS Percent: 15 %
Brady Statistic AS VP Percent: 1 %
Brady Statistic AS VS Percent: 84 %
Brady Statistic RA Percent Paced: 15 %
Brady Statistic RV Percent Paced: 1 %
Date Time Interrogation Session: 20250714020015
Implantable Lead Connection Status: 753985
Implantable Lead Connection Status: 753985
Implantable Lead Implant Date: 20070119
Implantable Lead Implant Date: 20070119
Implantable Lead Location: 753859
Implantable Lead Location: 753860
Implantable Pulse Generator Implant Date: 20171017
Lead Channel Impedance Value: 300 Ohm
Lead Channel Impedance Value: 310 Ohm
Lead Channel Pacing Threshold Amplitude: 0.625 V
Lead Channel Pacing Threshold Amplitude: 0.75 V
Lead Channel Pacing Threshold Pulse Width: 0.4 ms
Lead Channel Pacing Threshold Pulse Width: 0.5 ms
Lead Channel Sensing Intrinsic Amplitude: 3.8 mV
Lead Channel Sensing Intrinsic Amplitude: 4.9 mV
Lead Channel Setting Pacing Amplitude: 2 V
Lead Channel Setting Pacing Amplitude: 5 V
Lead Channel Setting Pacing Pulse Width: 0.5 ms
Lead Channel Setting Sensing Sensitivity: 2 mV
Pulse Gen Model: 2272
Pulse Gen Serial Number: 7961150

## 2024-03-19 ENCOUNTER — Ambulatory Visit: Payer: Self-pay | Admitting: Cardiovascular Disease

## 2024-06-12 NOTE — Progress Notes (Signed)
 Remote PPM Transmission

## 2024-06-16 ENCOUNTER — Ambulatory Visit: Payer: Medicaid Other

## 2024-06-16 DIAGNOSIS — I495 Sick sinus syndrome: Secondary | ICD-10-CM

## 2024-06-16 LAB — CUP PACEART REMOTE DEVICE CHECK
Battery Remaining Longevity: 40 mo
Battery Remaining Percentage: 38 %
Battery Voltage: 2.96 V
Brady Statistic AP VP Percent: 1 %
Brady Statistic AP VS Percent: 18 %
Brady Statistic AS VP Percent: 1 %
Brady Statistic AS VS Percent: 82 %
Brady Statistic RA Percent Paced: 18 %
Brady Statistic RV Percent Paced: 1 %
Date Time Interrogation Session: 20251013020015
Implantable Lead Connection Status: 753985
Implantable Lead Connection Status: 753985
Implantable Lead Implant Date: 20070119
Implantable Lead Implant Date: 20070119
Implantable Lead Location: 753859
Implantable Lead Location: 753860
Implantable Pulse Generator Implant Date: 20171017
Lead Channel Impedance Value: 360 Ohm
Lead Channel Impedance Value: 410 Ohm
Lead Channel Pacing Threshold Amplitude: 0.625 V
Lead Channel Pacing Threshold Amplitude: 0.75 V
Lead Channel Pacing Threshold Pulse Width: 0.4 ms
Lead Channel Pacing Threshold Pulse Width: 0.5 ms
Lead Channel Sensing Intrinsic Amplitude: 5 mV
Lead Channel Sensing Intrinsic Amplitude: 5.5 mV
Lead Channel Setting Pacing Amplitude: 2 V
Lead Channel Setting Pacing Amplitude: 5 V
Lead Channel Setting Pacing Pulse Width: 0.5 ms
Lead Channel Setting Sensing Sensitivity: 2 mV
Pulse Gen Model: 2272
Pulse Gen Serial Number: 7961150

## 2024-06-17 NOTE — Progress Notes (Signed)
 Remote PPM Transmission

## 2024-06-30 ENCOUNTER — Ambulatory Visit: Payer: Self-pay | Admitting: Cardiovascular Disease

## 2024-09-15 ENCOUNTER — Ambulatory Visit

## 2024-09-15 DIAGNOSIS — I495 Sick sinus syndrome: Secondary | ICD-10-CM

## 2024-09-16 ENCOUNTER — Ambulatory Visit: Payer: Self-pay | Admitting: Cardiovascular Disease

## 2024-09-16 LAB — CUP PACEART REMOTE DEVICE CHECK
Battery Remaining Longevity: 37 mo
Battery Remaining Percentage: 36 %
Battery Voltage: 2.96 V
Brady Statistic AP VP Percent: 1 %
Brady Statistic AP VS Percent: 18 %
Brady Statistic AS VP Percent: 1 %
Brady Statistic AS VS Percent: 81 %
Brady Statistic RA Percent Paced: 18 %
Brady Statistic RV Percent Paced: 1 %
Date Time Interrogation Session: 20260112020015
Implantable Lead Connection Status: 753985
Implantable Lead Connection Status: 753985
Implantable Lead Implant Date: 20070119
Implantable Lead Implant Date: 20070119
Implantable Lead Location: 753859
Implantable Lead Location: 753860
Implantable Pulse Generator Implant Date: 20171017
Lead Channel Impedance Value: 330 Ohm
Lead Channel Impedance Value: 330 Ohm
Lead Channel Pacing Threshold Amplitude: 0.625 V
Lead Channel Pacing Threshold Amplitude: 0.75 V
Lead Channel Pacing Threshold Pulse Width: 0.4 ms
Lead Channel Pacing Threshold Pulse Width: 0.5 ms
Lead Channel Sensing Intrinsic Amplitude: 3.1 mV
Lead Channel Sensing Intrinsic Amplitude: 3.7 mV
Lead Channel Setting Pacing Amplitude: 2 V
Lead Channel Setting Pacing Amplitude: 5 V
Lead Channel Setting Pacing Pulse Width: 0.5 ms
Lead Channel Setting Sensing Sensitivity: 2 mV
Pulse Gen Model: 2272
Pulse Gen Serial Number: 7961150

## 2024-09-17 NOTE — Progress Notes (Signed)
 Remote PPM Transmission

## 2024-12-15 ENCOUNTER — Encounter

## 2025-03-16 ENCOUNTER — Encounter

## 2025-06-15 ENCOUNTER — Encounter

## 2025-09-14 ENCOUNTER — Encounter

## 2025-12-14 ENCOUNTER — Encounter
